# Patient Record
Sex: Female | Born: 1955
Health system: Southern US, Community
[De-identification: ages and names within clinical notes are randomized; demographics above are authoritative.]

## PROBLEM LIST (undated history)

## (undated) DIAGNOSIS — J329 Chronic sinusitis, unspecified: Secondary | ICD-10-CM

## (undated) DIAGNOSIS — J302 Other seasonal allergic rhinitis: Secondary | ICD-10-CM

## (undated) DIAGNOSIS — Z860100 Personal history of colon polyps, unspecified: Secondary | ICD-10-CM

## (undated) DIAGNOSIS — I10 Essential (primary) hypertension: Secondary | ICD-10-CM

## (undated) DIAGNOSIS — T7840XA Allergy, unspecified, initial encounter: Secondary | ICD-10-CM

## (undated) HISTORY — PX: TUBAL LIGATION: SHX77

## (undated) HISTORY — DX: Allergy, unspecified, initial encounter: T78.40XA

## (undated) HISTORY — DX: Personal history of colonic polyps: Z86.010

## (undated) HISTORY — DX: Chronic sinusitis, unspecified: J32.9

## (undated) HISTORY — PX: ESOPHAGOGASTRODUODENOSCOPY: SHX1529

## (undated) HISTORY — DX: Personal history of colon polyps, unspecified: Z86.0100

## (undated) HISTORY — PX: SMALL INTESTINE SURGERY: SHX150

---

## 2004-06-24 HISTORY — PX: ABDOMINAL HYSTERECTOMY: SHX81

## 2004-06-24 HISTORY — PX: TOTAL ABDOMINAL HYSTERECTOMY: SHX209

## 2004-12-04 ENCOUNTER — Other Ambulatory Visit: Admission: RE | Admit: 2004-12-04 | Discharge: 2004-12-04 | Payer: Self-pay | Admitting: Obstetrics and Gynecology

## 2009-06-29 ENCOUNTER — Encounter: Payer: Self-pay | Admitting: Family Medicine

## 2009-07-04 LAB — CONVERTED CEMR LAB: LDL Cholesterol: 127 mg/dL

## 2009-07-05 ENCOUNTER — Ambulatory Visit: Payer: Self-pay | Admitting: Family Medicine

## 2009-07-05 DIAGNOSIS — H6690 Otitis media, unspecified, unspecified ear: Secondary | ICD-10-CM | POA: Insufficient documentation

## 2009-07-05 DIAGNOSIS — I10 Essential (primary) hypertension: Secondary | ICD-10-CM | POA: Insufficient documentation

## 2009-07-06 ENCOUNTER — Encounter (INDEPENDENT_AMBULATORY_CARE_PROVIDER_SITE_OTHER): Payer: Self-pay | Admitting: *Deleted

## 2009-07-07 ENCOUNTER — Telehealth (INDEPENDENT_AMBULATORY_CARE_PROVIDER_SITE_OTHER): Payer: Self-pay | Admitting: *Deleted

## 2009-08-16 ENCOUNTER — Ambulatory Visit: Payer: Self-pay | Admitting: Family Medicine

## 2009-08-16 DIAGNOSIS — G43009 Migraine without aura, not intractable, without status migrainosus: Secondary | ICD-10-CM

## 2009-08-16 DIAGNOSIS — J309 Allergic rhinitis, unspecified: Secondary | ICD-10-CM | POA: Insufficient documentation

## 2009-08-17 LAB — CONVERTED CEMR LAB
BUN: 17 mg/dL (ref 6–23)
CO2: 30 meq/L (ref 19–32)
Chloride: 105 meq/L (ref 96–112)
Glucose, Bld: 90 mg/dL (ref 70–99)
Potassium: 3.7 meq/L (ref 3.5–5.3)

## 2010-02-15 ENCOUNTER — Ambulatory Visit: Payer: Self-pay | Admitting: Family Medicine

## 2010-02-15 DIAGNOSIS — H00019 Hordeolum externum unspecified eye, unspecified eyelid: Secondary | ICD-10-CM | POA: Insufficient documentation

## 2010-06-21 ENCOUNTER — Ambulatory Visit: Payer: Self-pay | Admitting: Family Medicine

## 2010-06-21 DIAGNOSIS — M545 Low back pain: Secondary | ICD-10-CM

## 2010-07-24 NOTE — Assessment & Plan Note (Signed)
Summary: HTN, allergies   Vital Signs:  Patient profile:   55 year old female Height:      63 inches Weight:      138 pounds Pulse rate:   71 / minute BP sitting:   99 / 66  (left arm) Cuff size:   regular  Vitals Entered By: Kathlene November (August 16, 2009 10:31 AM) CC: followup BP, Hypertension Management   Primary Care Provider:  Nani Gasser, MD  CC:  followup BP and Hypertension Management.  History of Present Illness: Havin alot of sinus and drainage. Having alot of sinus pressure. Says did see ENT and they think she has migraines.  Nasal itching and some sneezing.  No prior hx of allegies.Not taking anything for her current sxs.  no worsening or alleviating sxs.  Was given Nabumetone and told to f/u in 6 weeks.   Hypertension History:      She complains of headache, but denies chest pain, palpitations, dyspnea with exertion, orthopnea, PND, peripheral edema, visual symptoms, neurologic problems, syncope, and side effects from treatment.  She notes the following problems with antihypertensive medication side effects: Feels a little fatigue and weak. Marland Kitchen        Positive major cardiovascular risk factors include hypertension and current tobacco user.  Negative major cardiovascular risk factors include female age less than 55 years old.     Current Medications (verified): 1)  Vivelle-Dot 0.0375 Mg/24hr Pttw (Estradiol) .... Change Patch Every 7 Days 2)  Lisinopril-Hydrochlorothiazide 10-12.5 Mg Tabs (Lisinopril-Hydrochlorothiazide) .... Take 1 Tablet By Mouth Once A Day  Allergies (verified): 1)  Darvocet  Comments:  Nurse/Medical Assistant: The patient's medications and allergies were reviewed with the patient and were updated in the Medication and Allergy Lists. Kathlene November (August 16, 2009 10:32 AM)  Physical Exam  General:  Well-developed,well-nourished,in no acute distress; alert,appropriate and cooperative throughout examination Head:  Normocephalic and  atraumatic without obvious abnormalities. No apparent alopecia or balding. Eyes:  No corneal or conjunctival inflammation noted. EOMI. Perrla. Ears:  External ear exam shows no significant lesions or deformities.  Otoscopic examination reveals clear canals, tympanic membranes are intact bilaterally without bulging, retraction, inflammation or discharge. Hearing is grossly normal bilaterally. Nose:  External nasal examination shows no deformity or inflammation. Turbinates are swollen.  Mouth:  Oral mucosa and oropharynx without lesions or exudates.  Teeth in good repair. Neck:  No deformities, masses, or tenderness noted. Lungs:  Normal respiratory effort, chest expands symmetrically. Lungs are clear to auscultation, no crackles or wheezes. Heart:  Normal rate and regular rhythm. S1 and S2 normal without gallop, murmur, click, rub or other extra sounds. Skin:  no rashes.   Cervical Nodes:  No lymphadenopathy noted Psych:  Cognition and judgment appear intact. Alert and cooperative with normal attention span and concentration. No apparent delusions, illusions, hallucinations   Impression & Recommendations:  Problem # 1:  HYPERTENSION, BENIGN (ICD-401.1) Her BP is really too low today and she if feeling fatigued.  Will drop the diuretic and use only the lisinopril. F/U in 6 weeks to recheck BP.  If normal at that point will f/u in 6 months. Due to recheck potassium and Cr on the ACEi.   Will fax new rx to [facility]  Her updated medication list for this problem includes:    Lisinopril 10 Mg Tabs (Lisinopril) .Marland Kitchen... Take 1 tablet by mouth once a day  Orders: T-Basic Metabolic Panel [phone])  Problem # 2:  ALLERGIC RHINITIS (ICD-477.9) Assessment: New Trial of  zyrtec  for sxs relief. Call if not better. Will f/u in 6 weeks. This may also be aggrevating her migraines.   Complete Medication List: 1)  Vivelle-dot 0.0375 Mg/24hr Pttw (Estradiol) .... Change patch every 7 days 2)   Lisinopril 10 Mg Tabs (Lisinopril) .... Take 1 tablet by mouth once a day  Hypertension Assessment/Plan:      The patient's hypertensive risk group is category B: At least one risk factor (excluding diabetes) with no target organ damage.  Her calculated 10 year risk of coronary heart disease is 4 %.  Today's blood pressure is 99/66.      Patient Instructions: 1)  Try Zyrtec once a day at bedtime.  2)  Follow up in 6 weeks for repeat Blood pressure check.  Prescriptions: LISINOPRIL 10 MG TABS (LISINOPRIL) Take 1 tablet by mouth once a day  #30 x 2   Entered and Authorized by:   Nani Gasser MD   Signed by:   Nani Gasser MD on 08/16/2009   Method used:   Printed then faxed to ...         RxID:   [id number]

## 2010-07-24 NOTE — Progress Notes (Signed)
----   Converted from flag ---- ---- 07/06/2009 11:51 AM, Nani Gasser MD wrote: Call pt: did get a copy of her labs from Dr. Neva Seat.  Cholesterol is high so lets work on diet and exercise for 3 months and then recheck. Low fat diet. ------------------------------  Pt notified of MD instructions. KJ LPN

## 2010-07-24 NOTE — Assessment & Plan Note (Signed)
Summary: Lesion on the eye   Vital Signs:  Patient profile:   55 year old female Height:      63 inches Weight:      125 pounds Pulse rate:   69 / minute BP sitting:   153 / 86  (left arm) Cuff size:   regular  Vitals Entered By: Avon Gully CMA, Duncan Dull) (February 15, 2010 9:21 AM) CC: lesion on the eyelid rim x several days was large and resembled a stye but has since burst and now is smaller   Primary Care Provider:  Nani Gasser, MD  CC:  lesion on the eyelid rim x several days was large and resembled a stye but has since burst and now is smaller.  History of Present Illness: lesion on the eyelid rim x several days was large and resembled a stye but has since burst and now is smaller. Still alittle tender. No vision change or eye pain.   Had dry ithcy rash on her right elbow. Some on her left elbow as well. Just using lotion on it.    Current Medications (verified): 1)  Vivelle-Dot 0.0375 Mg/24hr Pttw (Estradiol) .... Change Patch Every 7 Days 2)  Lisinopril 10 Mg Tabs (Lisinopril) .... Take 1 Tablet By Mouth Once A Day  Allergies (verified): 1)  Darvocet  Comments:  Nurse/Medical Assistant: The patient's medications and allergies were reviewed with the patient and were updated in the Medication and Allergy Lists. Avon Gully CMA, Duncan Dull) (February 15, 2010 9:22 AM)  Physical Exam  General:  Well-developed,well-nourished,in no acute distress; alert,appropriate and cooperative throughout examination Eyes:  Left upper eye lid with  a little edema at the center along the lid edge. No papule or vesciel pesent. No active drainage.  Skin:  no rashes.   Cervical Nodes:  No lymphadenopathy noted Psych:  Cognition and judgment appear intact. Alert and cooperative with normal attention span and concentration. No apparent delusions, illusions, hallucinations   Impression & Recommendations:  Problem # 1:  HORDEOLUM (ICD-373.11) Assessment New Dsicussed warm  compressed and will tx with ABX ointmen. If not complelely resolving in 10 days then will refer to ophthalmology for further tx.   Complete Medication List: 1)  Vivelle-dot 0.0375 Mg/24hr Pttw (Estradiol) .... Change patch every 7 days 2)  Lisinopril 10 Mg Tabs (Lisinopril) .... Take 1 tablet by mouth once a day 3)  Triamcinolone Acetonide 0.5 % Crea (Triamcinolone acetonide) .... Apply two times a day to affected area. 4)  Erythromycin 5 Mg/gm Oint (Erythromycin) .... Applhy 1/2 inch ribbon to lower lid 4 x a day for one week.  Patient Instructions: 1)  If eyelid is not better in 10 days then call the office.  Prescriptions: ERYTHROMYCIN 5 MG/GM OINT (ERYTHROMYCIN) Applhy 1/2 inch ribbon to lower lid 4 x a day for one week.  #1 tube x 0   Entered and Authorized by:   Nani Gasser MD   Signed by:   Nani Gasser MD on 02/15/2010   Method used:   Print then Give to Patient   RxID:   [id number] TRIAMCINOLONE ACETONIDE 0.5 % CREA (TRIAMCINOLONE ACETONIDE) Apply two times a day to affected area.  #20 grams x 1   Entered and Authorized by:   Nani Gasser MD   Signed by:   Nani Gasser MD on 02/15/2010   Method used:   Print then Give to Patient   RxID:   [id number]

## 2010-07-24 NOTE — Letter (Signed)
Summary: Primary Care Consult Scheduled Letter  [place] [facility]  [address]   [place], [place] [address]   Phone: [phone]  Fax: [phone]      07/06/2009 MRN: [id number]  [facility] [address] [place], [place]  [address]    Dear Ms. Staggs,      We have scheduled an appointment for you.  At the recommendation of Dr.Metheney, we have scheduled you a consult with [facility] , Dr Terrall Laity on January 31  at 10:15 .  Their address is [address] [place] . The office phone number is [phone].  If this appointment day and time is not convenient for you, please feel free to call the office of the doctor you are being referred to at the number listed above and reschedule the appointment.     It is important for you to keep your scheduled appointments. We are here to make sure you are given good patient care. If you have questions or you have made changes to your appointment, please notify us at  [phone], ask for [facility].    Thank you,  Patient Care Coordinator [place] at [facility]

## 2010-07-24 NOTE — Assessment & Plan Note (Signed)
Summary: NOV: HTN, recurrent OM   Vital Signs:  Patient profile:   55 year old female Height:      63 inches Weight:      131 pounds BMI:     23.29 Pulse rate:   72 / minute BP sitting:   150 / 86  (left arm) Cuff size:   regular  Vitals Entered By: Kathlene November (July 05, 2009 1:54 PM) CC: NP- BP elevated. Has recurrent ear infections- last one was 2-3 days ago Is Patient Diabetic? No   Primary Care Provider:  Nani Gasser, MD  CC:  NP- BP elevated. Has recurrent ear infections- last one was 2-3 days ago.  History of Present Illness: NP- BP elevated. Has recurrent ear infections- last one was 2-3 days ago.  Was on lisinoprol 10/12.5mg   but has been awhile. Did well on this medicine without side effects. Has been over a year. Occ notices some swelling her hands, none in her feet.  Having vision changes and aches. No hx of kidney disease.    Hx of recurrent ear infecgtions on her right side. Had 3 episodes this year.  Has been to [facility] for these and it does clear up. No hearing loss.  NO pain or symptoms right now.  Last infection was about 3 weeks ago. Occ will drain with infection.  Will start out as soreness. Hx of allergies but not actively taking any allergy meds.  Occ will notice some mild nasal congestion.   She is schedule for her colonoscopy and her mammogram.    Habits & Providers  Alcohol-Tobacco-Diet     Alcohol drinks/day: 0     Tobacco Status: current     Cigarette Packs/Day: 0.25     Year Started: 10 years  Exercise-Depression-Behavior     Does Patient Exercise: no     STD Risk: never     Drug Use: never     Seat Belt Use: always  Current Medications (verified): 1)  Vivelle-Dot 0.0375 Mg/24hr Pttw (Estradiol) .... Change Patch Every 7 Days  Allergies (verified): 1)  Darvocet  Comments:  Nurse/Medical Assistant: The patient's medications and allergies were reviewed with the patient and were updated in the Medication and Allergy Lists. Kathlene November  (July 05, 2009 1:57 PM)  Past History:  Past Surgical History: Hysterectomy and one ovary 2006 Tubal ligation  Family History: Sister with BrCa Father with MI Aunts with HTN.   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  3 y college.  Married sot [place] with 2 adult kids.  Smoking Status:  current Packs/Day:  0.25 Does Patient Exercise:  no STD Risk:  never Drug Use:  never Seat Belt Use:  always  Review of Systems       No fever/sweats/weakness, unexplained weight loss/gain.  No vison changes.  No difficulty hearing/ringing in ears, + hay fever/allergies.  No chest pain/discomfort, palpitations.  No Br lump/nipple discharge.  No cough/wheeze.  No blood in BM, nausea/vomiting/diarrhea.  No nighttime urination, leaking urine, unusual vaginal bleeding, discharge (penis or vagina).  No muscle/joint pain. No rash, change in mole.  + HA, no memory loss.  No anxiety, + sleep d/o, no depression.  No easy bruising/bleeding, unexplained lump   Physical Exam  General:  Well-developed,well-nourished,in no acute distress; alert,appropriate and cooperative throughout examination Head:  Normocephalic and atraumatic without obvious abnormalities. No apparent alopecia or balding. Eyes:  No corneal or conjunctival inflammation noted. EOMI. Perrla.  Ears:  External ear exam shows  no significant lesions or deformities.  Otoscopic examination reveals clear canals, tympanic membranes are intact bilaterally without bulging, retraction, inflammation or discharge. Hearing is grossly normal bilaterally. Mouth:  Oral mucosa and oropharynx without lesions or exudates.  Teeth in good repair. Neck:  No deformities, masses, or tenderness noted. NO TM.  Lungs:  Normal respiratory effort, chest expands symmetrically. Lungs are clear to auscultation, no crackles or wheezes. Heart:  Normal rate and regular rhythm. S1 and S2 normal without gallop, murmur, click, rub or other extra sounds. No  carotid or abdominal bruits.  Pulses:  Radial 2+.  Extremities:  No LE edema.  Neurologic:  alert & oriented X3 and gait normal.   Skin:  no rashes.   Cervical Nodes:  Right anterior cern node is mildly swollen and tender.  Psych:  Cognition and judgment appear intact. Alert and cooperative with normal attention span and concentration. No apparent delusions, illusions, hallucinations   Impression & Recommendations:  Problem # 1:  HYPERTENSION, BENIGN (ICD-401.1) Pt just had labs done at her OB office so will call and get these adn need to make sure she has had CMP, Lipids, and TSH. If not then I will callher and we will get these. Discussed since did so well on the lisinopril HCT will restart it. Call if SE. FU in 6 weeks to recheck and make sur BP well controlled.  Her updated medication list for this problem includes:    Lisinopril-hydrochlorothiazide 10-12.5 Mg Tabs (Lisinopril-hydrochlorothiazide) .Marland Kitchen... Take 1 tablet by mouth once a day  Problem # 2:  OTITIS MEDIA, RECURRENT (ICD-382.9) Right TM is clear today but she does have a swollen LN. Unsure if residual from last infection or not.  Asked her to call if gets larger or more tender or ear starts hurting.  Will refer to ENT.    Orders: ENT Referral (ENT)  Complete Medication List: 1)  Vivelle-dot 0.0375 Mg/24hr Pttw (Estradiol) .... Change patch every 7 days 2)  Lisinopril-hydrochlorothiazide 10-12.5 Mg Tabs (Lisinopril-hydrochlorothiazide) .... Take 1 tablet by mouth once a day  Other Orders: Tdap => 21yrs IM (04540) Admin 1st Vaccine (98119) :  Patient Instructions: 1)  Please schedule a follow-up appointment in 6 weeks to recheck Blood pressure.  2)  We will call you if we need extra labs.  Prescriptions: LISINOPRIL-HYDROCHLOROTHIAZIDE 10-12.5 MG TABS (LISINOPRIL-HYDROCHLOROTHIAZIDE) Take 1 tablet by mouth once a day  #30 x 1   Entered and Authorized by:   Nani Gasser MD   Signed by:   Nani Gasser MD on  07/05/2009   Method used:   Print then Give to Patient   RxID:   [id number]   Flu Vaccine Result Date:  04/24/2009 Flu Vaccine Result:  given Flu Vaccine Next Due:  1 yr   Immunizations Administered:  Tetanus Vaccine:    Vaccine Type: Tdap    Site: left deltoid    Mfr: GlaxoSmithKline    Dose: 0.5 ml    Route: IM    Given by: Kathlene November    Exp. Date: 08/19/2011    Lot #: [id number]    VIS given: 05/12/07 version given July 05, 2009.    Lipid Panel Test Date: 07/04/2009                        Value        Units        H/L   Reference  Cholesterol:  223          mg/dL              (045-409) LDL Cholesterol:      127          mg/dL              (81-191) HDL Cholesterol:      84           mg/dL         H    (47-82) Triglyceride:         58           mg/dL              (95-621)
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
## 2010-07-27 NOTE — Letter (Signed)
Summary: [facility]  [facility]   Imported By: Lanelle Bal 07/13/2009 11:38:06  _____________________________________________________________________  External Attachment:    Type:   Image     Comment:   External Document

## 2010-08-07 ENCOUNTER — Encounter: Payer: Self-pay | Admitting: Family Medicine

## 2010-08-21 NOTE — Medication Information (Signed)
Summary: Medication Nonadherence Advisory  Medication Nonadherence Advisory   Imported By: Maryln Gottron 08/13/2010 11:10:15  _____________________________________________________________________  External Attachment:    Type:   Image     Comment:   External Document

## 2010-10-18 ENCOUNTER — Encounter: Payer: Self-pay | Admitting: Family Medicine

## 2010-10-19 ENCOUNTER — Ambulatory Visit (INDEPENDENT_AMBULATORY_CARE_PROVIDER_SITE_OTHER): Payer: BC Managed Care – PPO | Admitting: Family Medicine

## 2010-10-19 ENCOUNTER — Encounter: Payer: Self-pay | Admitting: Family Medicine

## 2010-10-19 DIAGNOSIS — I10 Essential (primary) hypertension: Secondary | ICD-10-CM

## 2010-10-19 DIAGNOSIS — J329 Chronic sinusitis, unspecified: Secondary | ICD-10-CM

## 2010-10-19 MED ORDER — LISINOPRIL-HYDROCHLOROTHIAZIDE 20-25 MG PO TABS: 1.0000 | ORAL_TABLET | Freq: Every day | ORAL | Status: DC

## 2010-10-19 MED ORDER — FLUTICASONE PROPIONATE 50 MCG/ACT NA SUSP: 2.0000 | Freq: Every day | NASAL | Status: DC

## 2010-10-19 NOTE — Progress Notes (Signed)
  Subjective:    Patient ID: Gina Hopkins, female    DOB: [birth year], 55 y.o.   MRN: [id number]  HPI Nasal congestion and HA for a few ays. Taking zyrtec for allergies.  Lots of drainage.  Had a right ear ache 5 days ago. Feels stopped up today but not hurting. Feels like she has a fever today. Says her allergies have been severe thi syear. Took some nyquail, no decongestants.   Hypertension-no chest pain shortness of breath or dizziness. She is taking her medication regularly. She denies any side effects with the medication. As above she is to take some NyQuil. She denies taking any decongestants.   Review of Systems     Objective:   Physical Exam  Constitutional: She is oriented to person, place, and time. She appears well-developed and well-nourished.  HENT:  Head: Normocephalic and atraumatic.  Right Ear: External ear normal.  Left Ear: External ear normal.  Nose: Nose normal.  Mouth/Throat: Oropharynx is clear and moist.       Turbinates are very swollen.  Eyes: Conjunctivae are normal. Pupils are equal, round, and reactive to light.  Neck: Neck supple. No thyromegaly present.  Cardiovascular: Normal rate, regular rhythm and normal heart sounds.   Pulmonary/Chest: Effort normal and breath sounds normal.  Lymphadenopathy:    She has no cervical adenopathy.  Neurological: She is alert and oriented to person, place, and time.  Skin: Skin is warm and dry.  Psychiatric: She has a normal mood and affect.          Assessment & Plan:  Sinusitis-I will treat her with an antibiotic. She also has significant edema of the turbinates so I will add a nasal steroid to oral antihistamine. She can call if she's not better in 7-10 days. Followup for reexamination at that time she's not improved.

## 2010-10-19 NOTE — Assessment & Plan Note (Signed)
Poorly controlled. She did take; last night which certainly could be contributing to her heart pressure today. The last visit she ran in the 150s systolic. We will add a diuretic to her regimen and followup in one month.

## 2010-10-19 NOTE — Patient Instructions (Signed)
Call me if not feeling better by about Wednesday with your sinsuses.

## 2010-10-23 ENCOUNTER — Telehealth: Payer: Self-pay | Admitting: Family Medicine

## 2010-10-23 NOTE — Telephone Encounter (Signed)
Pt left messge.  Had office visit Friday.  Lisinopril 20/25 was suppose to be sent to [facility] and it is not there, therefore will send since according to system it was printed and not sent.  Called [facility] and called lisinopril 20/25 mg po #[id number].  Pt notified.  Had to [facility] for patient. Jarvis Newcomer, LPN [facility] Dimes

## 2011-06-07 ENCOUNTER — Encounter: Payer: Self-pay | Admitting: Family Medicine

## 2011-06-07 ENCOUNTER — Ambulatory Visit (INDEPENDENT_AMBULATORY_CARE_PROVIDER_SITE_OTHER): Payer: BC Managed Care – PPO | Admitting: Family Medicine

## 2011-06-07 VITALS — BP 137/80 | HR 71 | Temp 97.9°F | Wt 131.0 lb

## 2011-06-07 DIAGNOSIS — M79609 Pain in unspecified limb: Secondary | ICD-10-CM

## 2011-06-07 DIAGNOSIS — Z1211 Encounter for screening for malignant neoplasm of colon: Secondary | ICD-10-CM

## 2011-06-07 DIAGNOSIS — J329 Chronic sinusitis, unspecified: Secondary | ICD-10-CM

## 2011-06-07 DIAGNOSIS — N949 Unspecified condition associated with female genital organs and menstrual cycle: Secondary | ICD-10-CM

## 2011-06-07 DIAGNOSIS — R202 Paresthesia of skin: Secondary | ICD-10-CM

## 2011-06-07 DIAGNOSIS — R209 Unspecified disturbances of skin sensation: Secondary | ICD-10-CM

## 2011-06-07 DIAGNOSIS — R102 Pelvic and perineal pain: Secondary | ICD-10-CM

## 2011-06-07 DIAGNOSIS — R300 Dysuria: Secondary | ICD-10-CM

## 2011-06-07 DIAGNOSIS — M79606 Pain in leg, unspecified: Secondary | ICD-10-CM

## 2011-06-07 LAB — POCT URINALYSIS DIPSTICK
Blood, UA: NEGATIVE
Glucose, UA: NEGATIVE
Leukocytes, UA: NEGATIVE
Nitrite, UA: NEGATIVE
Spec Grav, UA: 1.025
Urobilinogen, UA: 1
pH, UA: 5.5

## 2011-06-07 MED ORDER — AMOXICILLIN-POT CLAVULANATE 875-125 MG PO TABS: 1.0000 | ORAL_TABLET | Freq: Two times a day (BID) | ORAL | Status: DC

## 2011-06-07 NOTE — Progress Notes (Signed)
  Subjective:    Patient ID: Gina Hopkins, female    DOB: [birth year], 55 y.o.   MRN: [id number]  Sinusitis This is a new problem. The current episode started 1 to 4 weeks ago (2 weeks). The problem is unchanged. Maximum temperature: subjective fever. The pain is mild. Associated symptoms include congestion, coughing, ear pain, headaches, a hoarse voice, shortness of breath, sinus pressure, sneezing and a sore throat. Past treatments include spray decongestants and oral decongestants (flonase). The treatment provided mild relief.   Will get stinging pain in her thighs, cna happen when walking.  Started maybe several months ago. No worsening or alleviating sxs. Describes a peircing sensation that lasts for 15-20 minutes and will feel sore. Not on a statin. No known injury. Occ low back ache but stands on concret floors all day. Her thigh muscles are tender.   Says will have to hold her abdomen when she sits up in bed int eh morning  No nausea.  Bowels are moving normall. No blood in the stool.  Hx of hysterectomy. Still has one ovary.  Urinary frequency.   Review of Systems  HENT: Positive for ear pain, congestion, sore throat, hoarse voice, sneezing and sinus pressure.   Respiratory: Positive for cough and shortness of breath.   Neurological: Positive for headaches.       Objective:   Physical Exam  Constitutional: She is oriented to person, place, and time. She appears well-developed and well-nourished.  HENT:  Head: Normocephalic and atraumatic.  Right Ear: External ear normal.  Left Ear: External ear normal.  Nose: Nose normal.  Mouth/Throat: Oropharynx is clear and moist.       TMs and canals are clear.   Eyes: Conjunctivae and EOM are normal. Pupils are equal, round, and reactive to light.  Neck: Neck supple. No thyromegaly present.  Cardiovascular: Normal rate, regular rhythm and normal heart sounds.   Pulmonary/Chest: Effort normal and breath sounds normal. She has no wheezes.   Abdominal: Soft. Bowel sounds are normal. She exhibits no distension and no mass. There is tenderness. There is no rebound and no guarding.       Suprapubic tenderness.   Lymphadenopathy:    She has no cervical adenopathy.  Neurological: She is alert and oriented to person, place, and time.  Skin: Skin is warm and dry.  Psychiatric: She has a normal mood and affect. Her behavior is normal.          Assessment & Plan:  Sinusitis x 2 weeks. Will tx with ABX. Call if not better in one week. Symptomatic care.  Leg pain /stingling. - Unclear etiology. Based on her description it is unclear what exactly this is. I would like to check a thyroid level and B12 and iron to rule out a neuropathy. There is symptoms are bilateral. Also consider this could be coming from her low back. She does have some occasional discomfort when she stands for prolonged periods but nothing significant. She has not any medications that should be causing her symptoms.  Urinary frequency - UA neg except for protein. Will send a culture.   Pelvic Pain - Has never had her screening colonoscopy. Will refer. Pt agreeable.  Will check Ca-125. UA is neg except for protein If labs are negative. If labs normal consider STD testing and pelvic US. Maybe related to pain in her thighs

## 2011-06-07 NOTE — Patient Instructions (Signed)
Call me if not better in one week.  

## 2011-06-09 LAB — URINE CULTURE

## 2011-06-10 ENCOUNTER — Other Ambulatory Visit: Payer: Self-pay | Admitting: Family Medicine

## 2011-06-10 DIAGNOSIS — M25559 Pain in unspecified hip: Secondary | ICD-10-CM

## 2011-06-11 LAB — COMPLETE METABOLIC PANEL WITH GFR
ALT: 10 U/L (ref 0–35)
AST: 16 U/L (ref 0–37)
Albumin: 4.4 g/dL (ref 3.5–5.2)
Alkaline Phosphatase: 74 U/L (ref 39–117)
BUN: 16 mg/dL (ref 6–23)
Calcium: 9.6 mg/dL (ref 8.4–10.5)
Chloride: 103 mEq/L (ref 96–112)
Potassium: 4.1 mEq/L (ref 3.5–5.3)
Sodium: 138 mEq/L (ref 135–145)
Total Protein: 7.1 g/dL (ref 6.0–8.3)

## 2011-06-11 LAB — CBC
HCT: 44.4 % (ref 36.0–46.0)
Hemoglobin: 14.2 g/dL (ref 12.0–15.0)
MCHC: 32 g/dL (ref 30.0–36.0)
RBC: 4.69 MIL/uL (ref 3.87–5.11)

## 2011-06-11 LAB — VITAMIN B12: Vitamin B-12: 352 pg/mL (ref 211–911)

## 2011-06-11 LAB — IRON: Iron: 125 ug/dL (ref 42–145)

## 2011-06-11 LAB — TSH: TSH: 2.666 u[IU]/mL (ref 0.350–4.500)

## 2011-06-13 ENCOUNTER — Telehealth: Payer: Self-pay | Admitting: *Deleted

## 2011-06-13 MED ORDER — SULFAMETHOXAZOLE-TRIMETHOPRIM 800-160 MG PO TABS: 1.0000 | ORAL_TABLET | Freq: Two times a day (BID) | ORAL | Status: AC

## 2011-06-13 NOTE — Telephone Encounter (Signed)
Pt on Augmentin for sinus infection and has 4 days left and it is making her really nauseated. Her sinus sx's are still present: H/A, cough, pressure. Please advise

## 2011-06-13 NOTE — Telephone Encounter (Signed)
Willl change ot bactrim. Make sure to take with food and water and try eating a yogurt cup daily or taking a probiotic.

## 2011-06-14 NOTE — Telephone Encounter (Signed)
Pt notified. KJ LPN 

## 2011-06-21 ENCOUNTER — Other Ambulatory Visit: Payer: Self-pay | Admitting: Family Medicine

## 2011-06-21 DIAGNOSIS — R102 Pelvic and perineal pain: Secondary | ICD-10-CM

## 2011-06-27 ENCOUNTER — Other Ambulatory Visit: Payer: Self-pay | Admitting: Family Medicine

## 2011-06-27 DIAGNOSIS — R102 Pelvic and perineal pain: Secondary | ICD-10-CM

## 2011-06-28 ENCOUNTER — Ambulatory Visit
Admission: RE | Admit: 2011-06-28 | Discharge: 2011-06-28 | Disposition: A | Discharge status: Home / Self Care | Payer: BC Managed Care – PPO | Source: Ambulatory Visit | Attending: Family Medicine | Admitting: Family Medicine

## 2011-06-28 ENCOUNTER — Other Ambulatory Visit: Payer: BC Managed Care – PPO

## 2011-06-28 DIAGNOSIS — R102 Pelvic and perineal pain: Secondary | ICD-10-CM

## 2011-07-11 ENCOUNTER — Encounter: Payer: Self-pay | Admitting: *Deleted

## 2011-07-11 ENCOUNTER — Emergency Department
Admission: EM | Admit: 2011-07-11 | Discharge: 2011-07-11 | Disposition: A | Discharge status: Home / Self Care | Payer: BC Managed Care – PPO | Source: Home / Self Care

## 2011-07-11 DIAGNOSIS — J069 Acute upper respiratory infection, unspecified: Secondary | ICD-10-CM

## 2011-07-11 DIAGNOSIS — J029 Acute pharyngitis, unspecified: Secondary | ICD-10-CM

## 2011-07-11 HISTORY — DX: Other seasonal allergic rhinitis: J30.2

## 2011-07-11 HISTORY — DX: Essential (primary) hypertension: I10

## 2011-07-11 LAB — POCT RAPID STREP A (OFFICE): Rapid Strep A Screen: NEGATIVE

## 2011-07-11 MED ORDER — AZITHROMYCIN 250 MG PO TABS: ORAL_TABLET | ORAL | Status: AC

## 2011-07-11 NOTE — ED Provider Notes (Signed)
History     CSN: [id number]  Arrival date & time 07/11/11  0956   None     Chief Complaint  Patient presents with  . Headache  . Sore Throat  . Nausea  . Otalgia    (Consider location/radiation/quality/duration/timing/severity/associated sxs/prior treatment) HPI Gina Hopkins is a 56 y.o. female who complains of onset of cold symptoms for 2-3 days. She already had the flu shot.  She is feeling a little better today compared to yesterday.  Taking Alkaseltzer and Tylenol. + sore throat No cough No pleuritic pain No wheezing + nasal congestion + post-nasal drainage No sinus pain/pressure No chest congestion No itchy/red eyes No earache No hemoptysis No SOB No chills/sweats + fever No nausea No vomiting No abdominal pain No diarrhea No skin rashes No fatigue No myalgias No headache    No past medical history on file.  Past Surgical History  Procedure Date  . Abdominal hysterectomy 2006    and one ovary  . Tubal ligation     Family History  Problem Relation Age of Onset  . Heart attack Father   . Cancer Sister     Br Ca  . Hypertension Other     History  Substance Use Topics  . Smoking status: Never Smoker   . Smokeless tobacco: Not on file  . Alcohol Use: Not on file    OB History    Grav Para Term Preterm Abortions TAB SAB Ect Mult Living                  Review of Systems  Allergies  Propoxyphene n-acetaminophen  Home Medications   Current Outpatient Rx  Name Route Sig Dispense Refill  . CYCLOBENZAPRINE HCL 10 MG PO TABS Oral Take 10 mg by mouth. 1/2 to 1 tab qhs for muscle spasm     . ESTRADIOL 0.0375 MG/24HR TD PTTW Transdermal Place 1 patch onto the skin every 7 (seven) days.      Marland Kitchen FLUTICASONE PROPIONATE 50 MCG/ACT NA SUSP Nasal 2 sprays by Nasal route daily. 16 g 2  . LISINOPRIL-HYDROCHLOROTHIAZIDE 20-25 MG PO TABS Oral Take 1 tablet by mouth daily. 30 tablet 2    There were no vitals taken for this visit.  Physical Exam  Nursing  note and vitals reviewed. Constitutional: She is oriented to person, place, and time. She appears well-developed and well-nourished.  HENT:  Head: Normocephalic and atraumatic.  Right Ear: Tympanic membrane, external ear and ear canal normal.  Left Ear: Tympanic membrane, external ear and ear canal normal.  Nose: Mucosal edema and rhinorrhea present.  Mouth/Throat: Posterior oropharyngeal erythema present. No oropharyngeal exudate or posterior oropharyngeal edema.  Eyes: No scleral icterus.  Neck: Neck supple.  Cardiovascular: Regular rhythm and normal heart sounds.   Pulmonary/Chest: Effort normal and breath sounds normal. No respiratory distress.  Neurological: She is alert and oriented to person, place, and time.  Skin: Skin is warm and dry.  Psychiatric: She has a normal mood and affect. Her speech is normal.    ED Course  Procedures (including critical care time)  Labs Reviewed - No data to display No results found.   No diagnosis found.    MDM  1)  Take the prescribed antibiotic as instructed.  Hold for a few days especially since getting better.  Rapid strep test negative. 2)  Use nasal saline solution (over the counter) at least 3 times a day. 3)  Use over the counter decongestants like Zyrtec-D every 12 hours as  needed to help with congestion.  If you have hypertension, do not take medicines with sudafed.  4)  Can take tylenol every 6 hours or motrin every 8 hours for pain or fever. 5)  Follow up with your primary doctor if no improvement in 5-7 days, sooner if increasing pain, fever, or new symptoms.     Lily Kocher, MD 07/11/11 [phone]

## 2011-07-11 NOTE — ED Notes (Signed)
Pt c/o sore throat, HA, dizziness, nausea, HA and RT ear ache x 2 days. She has taken tylenol and alka seltzer.

## 2011-07-30 ENCOUNTER — Encounter: Payer: Self-pay | Admitting: *Deleted

## 2012-01-30 ENCOUNTER — Encounter: Payer: Self-pay | Admitting: Family Medicine

## 2012-01-30 ENCOUNTER — Ambulatory Visit (INDEPENDENT_AMBULATORY_CARE_PROVIDER_SITE_OTHER): Payer: BC Managed Care – PPO | Admitting: Family Medicine

## 2012-01-30 VITALS — BP 129/82 | HR 69 | Ht 63.0 in | Wt 132.0 lb

## 2012-01-30 DIAGNOSIS — M7712 Lateral epicondylitis, left elbow: Secondary | ICD-10-CM

## 2012-01-30 DIAGNOSIS — I10 Essential (primary) hypertension: Secondary | ICD-10-CM

## 2012-01-30 DIAGNOSIS — M7710 Lateral epicondylitis, unspecified elbow: Secondary | ICD-10-CM

## 2012-01-30 MED ORDER — LISINOPRIL-HYDROCHLOROTHIAZIDE 20-25 MG PO TABS: 1.0000 | ORAL_TABLET | Freq: Every day | ORAL | Status: DC

## 2012-01-30 NOTE — Progress Notes (Signed)
CC: Gina Hopkins is a 56 y.o. female is here for Hypertension   Subjective: HPI: Patient presents due to concerns of elevated blood pressure work today. She is she became dizzy quite suddenly took her blood pressure at that time and tells me that was recorded at 188/87. At that time she denied any headaches, vision changes, motor or sensory disturbances, chest pain, palpitations, nor confusion. She states that she has never experienced any peripheral edema orthopnea nor shortness of breath. She admits to having trouble meeting 100% compliance with taking her lisinopril hydrochlorothiazide medicine every morning and admits to skipping medication today. She took her medication at the time the above blood pressure was recorded and within minutes felt better.  She also complains of some left arm pain that has been presents for matter of months. She describes it as an achy sensation that often radiates down the lateral dorsal part of her arm. 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 No interventions as of yet. She denies any motor weakness or any sensory disturbances in the arm. There's been no recent trauma. She denies any swelling, pallor, no coolness to the affected arm.   Review Of Systems Outlined In HPI  Past Medical History  Diagnosis Date  . Hypertension   . Seasonal allergies      Family History  Problem Relation Age of Onset  . Heart attack Father   . Cancer Sister     Br Ca  . Hypertension Sister   . Hypertension Other   . Hypertension Mother      History  Substance Use Topics  . Smoking status: Current Everyday Smoker -- 0.2 packs/day  . Smokeless tobacco: Not on file  . Alcohol Use: No     Objective: Filed Vitals:   01/30/12 1322  BP: 129/82  Pulse:     General: Alert and Oriented, No Acute Distress HEENT: Pupils equal, round, reactive to light. Conjunctivae clear.   Moist mucous  membranes, pharynx without inflammation nor lesions.  Neck supple without palpable lymphadenopathy nor abnormal masses. Lungs: Clear to auscultation bilaterally, no wheezing/ronchi/rales.  Comfortable work of breathing. Good air movement. Cardiac: Regular rate and rhythm. Normal S1/S2.  No murmurs, rubs, nor gallops.  No carotid bruits Extremities: No peripheral edema.  Strong peripheral pulses.  Tender to palpation of lateral left epicondile, pain reproduced with resisted wrist extension and passive wrist flexion.  Full rom and strenght in LUE. Mental Status: No depression, anxiety, nor agitation. Skin: Warm and dry.  Assessment & Plan: Gina Hopkins was seen today for hypertension.  Diagnoses and associated orders for this visit:  Hypertension, benign - lisinopril-hydrochlorothiazide (PRINZIDE,ZESTORETIC) 20-25 MG per tablet; Take 1 tablet by mouth daily.  Left tennis elbow  Other Orders - Discontinue: lisinopril-hydrochlorothiazide (PRINZIDE,ZESTORETIC) 20-25 MG per tablet; Take 1 tablet by mouth daily. - fluticasone (FLONASE) 50 MCG/ACT nasal spray; Place 2 sprays into the nose daily.    Manual blood pressures taken by myself which was in the preoperative range. Patient and I made  a joint decision that it be best for her to focus on compliance of taking her medication on a daily basis rather than increasing her medication to get better control of her hypertensive issues. Refills was provided. Encouraged low-salt diet physical activity to help lower blood pressure. For lateral epicondylitis stretching and range of motion exercises were given to the patient and shown to her by myself. I've asked  her to do this daily for 4 weeks. She was additionally given a brace which was applied and fit myself, I asked her to wear this whenever she is up doing repetitive movements and not to use it at night. Ice as needed and nonsteroidal anti-inflammatories as needed. Return in 4 weeks  Return in about 1  month (around 03/01/2012).  Requested Prescriptions   Signed Prescriptions Disp Refills  . lisinopril-hydrochlorothiazide (PRINZIDE,ZESTORETIC) 20-25 MG per tablet 30 tablet 11    Sig: Take 1 tablet by mouth daily.

## 2012-01-30 NOTE — Patient Instructions (Signed)
Lateral Epicondylitis (Tennis Elbow) with Rehab Lateral epicondylitis involves inflammation and pain around the outer portion of the elbow. The pain is caused by inflammation of the tendons in the forearm that bring back (extend) the wrist. Lateral epicondylittis is also called tennis elbow, because it is very common in tennis players. However, it may occur in any individual who extends the wrist repetitively. If lateral epicondylitis is left untreated, it may become a chronic problem. SYMPTOMS   Pain, tenderness, and inflammation on the outer (lateral) side of the elbow.   Pain or weakness with gripping activities.   Pain that increases with wrist twisting motions (playing tennis, using a screwdriver, opening a door or a jar).   Pain with lifting objects, including a coffee cup.  CAUSES  Lateral epicondylitis is caused by inflammation of the tendons that extend the wrist. Causes of injury may include:  Repetitive stress and strain on the muscles and tendons that extend the wrist.   Sudden change in activity level or intensity.   Incorrect grip in racquet sports.   Incorrect grip size of racquet (often too large).   Incorrect hitting position or technique (usually backhand, leading with the elbow).   Using a racket that is too heavy.  RISK INCREASES WITH:  Sports or occupations that require repetitive and/or strenuous forearm and wrist movements (tennis, squash, racquetball, carpentry).   Poor wrist and forearm strength and flexibility.   Failure to warm up properly before activity.   Resuming activity before healing, rehabilitation, and conditioning are complete.  PREVENTION   Warm up and stretch properly before activity.   Maintain physical fitness:   Strength, flexibility, and endurance.   Cardiovascular fitness.   Wear and use properly fitted equipment.   Learn and use proper technique and have a coach correct improper technique.   Wear a tennis elbow  (counterforce) brace.  PROGNOSIS  The course of this condition depends on the degree of the injury. If treated properly, acute cases (symptoms lasting less than 4 weeks) are often resolved in 2 to 6 weeks. Chronic (longer lasting cases) often resolve in 3 to 6 months, but may require physical therapy. RELATED COMPLICATIONS   Frequently recurring symptoms, resulting in a chronic problem. Properly treating the problem the first time decreases frequency of recurrence.   Chronic inflammation, scarring tendon degeneration, and partial tendon tear, requiring surgery.   Delayed healing or resolution of symptoms.  TREATMENT  Treatment first involves the use of ice and medicine, to reduce pain and inflammation. Strengthening and stretching exercises may help reduce discomfort, if performed regularly. These exercises may be performed at home, if the condition is an acute injury. Chronic cases may require a referral to a physical therapist for evaluation and treatment. Your caregiver may advise a corticosteroid injection, to help reduce inflammation. Rarely, surgery is needed. MEDICATION  If pain medicine is needed, nonsteroidal anti-inflammatory medicines (aspirin and ibuprofen), or other minor pain relievers (acetaminophen), are often advised.   Do not take pain medicine for 7 days before surgery.   Prescription pain relievers may be given, if your caregiver thinks they are needed. Use only as directed and only as much as you need.   Corticosteroid injections may be recommended. These injections should be reserved only for the most severe cases, because they can only be given a certain number of times.  HEAT AND COLD  Cold treatment (icing) should be applied for 10 to 15 minutes every 2 to 3 hours for inflammation and pain, and immediately   after activity that aggravates your symptoms. Use ice packs or an ice massage.   Heat treatment may be used before performing stretching and strengthening  activities prescribed by your caregiver, physical therapist, or athletic trainer. Use a heat pack or a warm water soak.  SEEK MEDICAL CARE IF: Symptoms get worse or do not improve in 2 weeks, despite treatment. EXERCISES  RANGE OF MOTION (ROM) AND STRETCHING EXERCISES - Epicondylitis, Lateral (Tennis Elbow) These exercises may help you when beginning to rehabilitate your injury. Your symptoms may go away with or without further involvement from your physician, physical therapist or athletic trainer. While completing these exercises, remember:   Restoring tissue flexibility helps normal motion to return to the joints. This allows healthier, less painful movement and activity.   An effective stretch should be held for at least 30 seconds.   A stretch should never be painful. You should only feel a gentle lengthening or release in the stretched tissue.  RANGE OF MOTION - Wrist Flexion, Active-Assisted  Extend your right / left elbow with your fingers pointing down.*   Gently pull the back of your hand towards you, until you feel a gentle stretch on the top of your forearm.   Hold this position for __________ seconds.  Repeat __________ times. Complete this exercise __________ times per day.  *If directed by your physician, physical therapist or athletic trainer, complete this stretch with your elbow bent, rather than extended. RANGE OF MOTION - Wrist Extension, Active-Assisted  Extend your right / left elbow and turn your palm upwards.*   Gently pull your palm and fingertips back, so your wrist extends and your fingers point more toward the ground.   You should feel a gentle stretch on the inside of your forearm.   Hold this position for __________ seconds.  Repeat __________ times. Complete this exercise __________ times per day. *If directed by your physician, physical therapist or athletic trainer, complete this stretch with your elbow bent, rather than extended. STRETCH - Wrist  Flexion  Place the back of your right / left hand on a tabletop, leaving your elbow slightly bent. Your fingers should point away from your body.   Gently press the back of your hand down onto the table by straightening your elbow. You should feel a stretch on the top of your forearm.   Hold this position for __________ seconds.  Repeat __________ times. Complete this stretch __________ times per day.  STRETCH - Wrist Extension   Place your right / left fingertips on a tabletop, leaving your elbow slightly bent. Your fingers should point backwards.   Gently press your fingers and palm down onto the table by straightening your elbow. You should feel a stretch on the inside of your forearm.   Hold this position for __________ seconds.  Repeat __________ times. Complete this stretch __________ times per day.  STRENGTHENING EXERCISES - Epicondylitis, Lateral (Tennis Elbow) These exercises may help you when beginning to rehabilitate your injury. They may resolve your symptoms with or without further involvement from your physician, physical therapist or athletic trainer. While completing these exercises, remember:   Muscles can gain both the endurance and the strength needed for everyday activities through controlled exercises.   Complete these exercises as instructed by your physician, physical therapist or athletic trainer. Increase the resistance and repetitions only as guided.   You may experience muscle soreness or fatigue, but the pain or discomfort you are trying to eliminate should never worsen during these exercises. If   this pain does get worse, stop and make sure you are following the directions exactly. If the pain is still present after adjustments, discontinue the exercise until you can discuss the trouble with your caregiver.  STRENGTH - Wrist Flexors  Sit with your right / left forearm palm-up and fully supported on a table or countertop. Your elbow should be resting below the  height of your shoulder. Allow your wrist to extend over the edge of the surface.   Loosely holding a __________ weight, or a piece of rubber exercise band or tubing, slowly curl your hand up toward your forearm.   Hold this position for __________ seconds. Slowly lower the wrist back to the starting position in a controlled manner.  Repeat __________ times. Complete this exercise __________ times per day.  STRENGTH - Wrist Extensors  Sit with your right / left forearm palm-down and fully supported on a table or countertop. Your elbow should be resting below the height of your shoulder. Allow your wrist to extend over the edge of the surface.   Loosely holding a __________ weight, or a piece of rubber exercise band or tubing, slowly curl your hand up toward your forearm.   Hold this position for __________ seconds. Slowly lower the wrist back to the starting position in a controlled manner.  Repeat __________ times. Complete this exercise __________ times per day.  STRENGTH - Ulnar Deviators  Stand with a ____________________ weight in your right / left hand, or sit while holding a rubber exercise band or tubing, with your healthy arm supported on a table or countertop.   Move your wrist, so that your pinkie travels toward your forearm and your thumb moves away from your forearm.   Hold this position for __________ seconds and then slowly lower the wrist back to the starting position.  Repeat __________ times. Complete this exercise __________ times per day STRENGTH - Radial Deviators  Stand with a ____________________ weight in your right / left hand, or sit while holding a rubber exercise band or tubing, with your injured arm supported on a table or countertop.   Raise your hand upward in front of you or pull up on the rubber tubing.   Hold this position for __________ seconds and then slowly lower the wrist back to the starting position.  Repeat __________ times. Complete this  exercise __________ times per day. STRENGTH - Forearm Supinators   Sit with your right / left forearm supported on a table, keeping your elbow below shoulder height. Rest your hand over the edge, palm down.   Gently grip a hammer or a soup ladle.   Without moving your elbow, slowly turn your palm and hand upward to a "thumbs-up" position.   Hold this position for __________ seconds. Slowly return to the starting position.  Repeat __________ times. Complete this exercise __________ times per day.  STRENGTH - Forearm Pronators   Sit with your right / left forearm supported on a table, keeping your elbow below shoulder height. Rest your hand over the edge, palm up.   Gently grip a hammer or a soup ladle.   Without moving your elbow, slowly turn your palm and hand upward to a "thumbs-up" position.   Hold this position for __________ seconds. Slowly return to the starting position.  Repeat __________ times. Complete this exercise __________ times per day.  STRENGTH - Grip  Grasp a tennis ball, a dense sponge, or a large, rolled sock in your hand.   Squeeze as hard   as you can, without increasing any pain.   Hold this position for __________ seconds. Release your grip slowly.  Repeat __________ times. Complete this exercise __________ times per day.  STRENGTH - Elbow Extensors, Isometric  Stand or sit upright, on a firm surface. Place your right / left arm so that your palm faces your stomach, and it is at the height of your waist.   Place your opposite hand on the underside of your forearm. Gently push up as your right / left arm resists. Push as hard as you can with both arms, without causing any pain or movement at your right / left elbow. Hold this stationary position for __________ seconds.  Gradually release the tension in both arms. Allow your muscles to relax completely before repeating. Document Released: 06/10/2005 Document Revised: 05/30/2011 Document Reviewed:  09/22/2008 [employer] Patient Information 2012 [employer]. 

## 2012-02-27 ENCOUNTER — Encounter: Payer: Self-pay | Admitting: Family Medicine

## 2012-02-27 ENCOUNTER — Ambulatory Visit (INDEPENDENT_AMBULATORY_CARE_PROVIDER_SITE_OTHER): Payer: BC Managed Care – PPO | Admitting: Family Medicine

## 2012-02-27 VITALS — BP 110/73 | HR 73 | Wt 127.0 lb

## 2012-02-27 DIAGNOSIS — Z720 Tobacco use: Secondary | ICD-10-CM

## 2012-02-27 DIAGNOSIS — M7710 Lateral epicondylitis, unspecified elbow: Secondary | ICD-10-CM

## 2012-02-27 DIAGNOSIS — I10 Essential (primary) hypertension: Secondary | ICD-10-CM

## 2012-02-27 DIAGNOSIS — E785 Hyperlipidemia, unspecified: Secondary | ICD-10-CM

## 2012-02-27 DIAGNOSIS — F17200 Nicotine dependence, unspecified, uncomplicated: Secondary | ICD-10-CM

## 2012-02-27 NOTE — Progress Notes (Signed)
  Subjective:    Patient ID: Gina Hopkins, female    DOB: Jul 09, 1955, 56 y.o.   MRN: [id number]  HPI HTN - Taking meds consistantly. N CP or SOB.   Left tennnis elbow.  Says has been wearing her brace. Once days she didn't wear it she felt pain radiating from her lateral elbow to her fingers. Felt her 4th adn 5th fingers were locking up.  Taking Extra strength tylenol.  She has been doing the exercises. She is right handed.   Review of Systems     Objective:   Physical Exam  Constitutional: She is oriented to person, place, and time. She appears well-developed and well-nourished.  Musculoskeletal:       Left elbow with normal range of motion. She was wearing her strength when she came in today. She is tender over the left lateral epicondyles. She's tender a little bit down her arm. Normal range of motion strength of her hand and fingers. No swelling or erythema.  Neurological: She is alert and oriented to person, place, and time.  Skin: Skin is warm and dry.  Psychiatric: She has a normal mood and affect. Her behavior is normal.         Assessment & Plan:  HTN- Well controlled today.  No chagnes.  F/U in 6 months.   Left latearl epicondylitis- discussed options of possible injection versus physical therapy versus a trial of Voltaren gel. She opted to try the gel for at least 2-3 weeks. If she's not improving at that time she said she'll likely call and make an appointment for an injection with me.   Hyperlipidemia-due to repeat lipids and CMP. Lab slip given today.  Tobacco abuse-encourage cessation.

## 2012-02-27 NOTE — Patient Instructions (Signed)
Apply to outer elbow 4 times a day.

## 2013-02-09 ENCOUNTER — Ambulatory Visit (INDEPENDENT_AMBULATORY_CARE_PROVIDER_SITE_OTHER): Payer: BC Managed Care – PPO

## 2013-02-09 ENCOUNTER — Ambulatory Visit (INDEPENDENT_AMBULATORY_CARE_PROVIDER_SITE_OTHER): Payer: BC Managed Care – PPO | Admitting: Family Medicine

## 2013-02-09 ENCOUNTER — Encounter: Payer: Self-pay | Admitting: Family Medicine

## 2013-02-09 ENCOUNTER — Encounter: Payer: Self-pay | Admitting: *Deleted

## 2013-02-09 VITALS — BP 88/63 | HR 72 | Ht 62.0 in | Wt 121.0 lb

## 2013-02-09 DIAGNOSIS — F17200 Nicotine dependence, unspecified, uncomplicated: Secondary | ICD-10-CM

## 2013-02-09 DIAGNOSIS — R002 Palpitations: Secondary | ICD-10-CM

## 2013-02-09 DIAGNOSIS — R0789 Other chest pain: Secondary | ICD-10-CM

## 2013-02-09 DIAGNOSIS — Z87891 Personal history of nicotine dependence: Secondary | ICD-10-CM

## 2013-02-09 DIAGNOSIS — Z720 Tobacco use: Secondary | ICD-10-CM

## 2013-02-09 DIAGNOSIS — I959 Hypotension, unspecified: Secondary | ICD-10-CM

## 2013-02-09 DIAGNOSIS — R05 Cough: Secondary | ICD-10-CM

## 2013-02-09 NOTE — Patient Instructions (Signed)
Cut your BP pill in half because your pressure is low today.

## 2013-02-09 NOTE — Progress Notes (Signed)
Subjective:    Patient ID: Gina Hopkins, female    DOB: 1956-05-01, 57 y.o.   MRN: [id number]  HPI  Palpitations - came on suddenly started yesterday, after ate lunch. Didn't feel stressed when it happened.  Had a soda for lunch but that is usual.  Lasted over an hour. Not sure how fast it was going.   has not had anymore episodes since. pt reports that she experienced a rapid heart beat x 1 hour. denies any SOB or CP.  Says afterwards lightheaded.  Felt a numbness and tingling in her left hand as well. That lasted the rest of the afternoon.  No diaphoresis. No hx of heart problems.  Had some palpitatins years ago.  ONce was told had a heart murmur.   Job is more stressful and has been working longer hours and often skips meals.   HTN-  Pt denies chest pain, SOB, dizziness, or swelling.  Taking meds as directed w/o problems.  Denies medication side effects.    Review of Systems  BP 88/63  Pulse 72  Ht 5\' 2"  (1.575 m)  Wt 121 lb (54.885 kg)  BMI 22.13 kg/m2    Allergies  Allergen Reactions  . Propoxyphene-Acetaminophen     REACTION: nausea    Past Medical History  Diagnosis Date  . Hypertension   . Seasonal allergies     Past Surgical History  Procedure Laterality Date  . Abdominal hysterectomy  2006    and one ovary  . Tubal ligation      History   Social History  . Marital Status: Married    Spouse Name: N/A    Number of Children: N/A  . Years of Education: N/A   Occupational History  . Not on file.   Social History Main Topics  . Smoking status: Current Every Day Smoker -- 1.50 packs/day  . Smokeless tobacco: Not on file  . Alcohol Use: No  . Drug Use: No  . Sexual Activity: Not on file   Other Topics Concern  . Not on file   Social History Narrative  . No narrative on file    Family History  Problem Relation Age of Onset  . Heart attack Father   . Cancer Sister     Br Ca  . Hypertension Sister   . Hypertension Other   . Hypertension  Mother     Outpatient Encounter Prescriptions as of 02/09/2013  Medication Sig Dispense Refill  . cetirizine (ZYRTEC) 10 MG tablet Take 10 mg by mouth daily.      . cyclobenzaprine (FLEXERIL) 10 MG tablet Take 10 mg by mouth. 1/2 to 1 tab qhs for muscle spasm       . estradiol (VIVELLE-DOT) 0.0375 MG/24HR Place 1 patch onto the skin every 7 (seven) days.        . fluticasone (FLONASE) 50 MCG/ACT nasal spray Place 2 sprays into the nose daily.      Marland Kitchen lisinopril-hydrochlorothiazide (PRINZIDE,ZESTORETIC) 20-25 MG per tablet Take 1 tablet by mouth daily.  30 tablet  11   No facility-administered encounter medications on file as of 02/09/2013.          Objective:   Physical Exam  Constitutional: She is oriented to person, place, and time. She appears well-developed and well-nourished.  HENT:  Head: Normocephalic and atraumatic.  Neck: Neck supple. No thyromegaly present.  Cardiovascular: Normal rate, regular rhythm and normal heart sounds.   No carotid bruit  Pulmonary/Chest: Effort normal and breath sounds  normal.  Musculoskeletal: She exhibits no edema.  Lymphadenopathy:    She has no cervical adenopathy.  Neurological: She is alert and oriented to person, place, and time.  Skin: Skin is warm and dry.  Psychiatric: She has a normal mood and affect. Her behavior is normal.          Assessment & Plan:  Palpitations - new dx. Could have been secondary to hypotensive event as her BP is low today and she has lost 10 lbs. So her need for BP medication may have dropped.  When I look at her weight she has lost weight unintentionally but says she has been exercising.  Says her appetite isdown overall.  Will get CXR, CBC, CMP, TSH. EKG shows rate of 65 beats per minute, normal sinus rhythm with normal axis. No acute ST-T wave changes. Will get CXR as well.   Avoid caffeine for now.  Hypotension - Cut pill in-half and recheck in 2 weeks.  Drink plenty of fluids today and make sure staying  well hydrated. Likely low secondary to weight loss.    HTN- see note above on hypotension.  Tobacco abuse-encourage cessation. Reminded her of the risk of heart attack and stroke

## 2013-02-10 LAB — COMPLETE METABOLIC PANEL WITH GFR
Alkaline Phosphatase: 55 U/L (ref 39–117)
BUN: 19 mg/dL (ref 6–23)
CO2: 31 mEq/L (ref 19–32)
Creat: 0.94 mg/dL (ref 0.50–1.10)
GFR, Est African American: 78 mL/min
GFR, Est Non African American: 68 mL/min
Glucose, Bld: 77 mg/dL (ref 70–99)
Sodium: 138 mEq/L (ref 135–145)
Total Bilirubin: 0.9 mg/dL (ref 0.3–1.2)
Total Protein: 7 g/dL (ref 6.0–8.3)

## 2013-02-10 LAB — CBC WITH DIFFERENTIAL/PLATELET
Basophils Relative: 0 % (ref 0–1)
HCT: 40.9 % (ref 36.0–46.0)
Hemoglobin: 13.3 g/dL (ref 12.0–15.0)
Lymphocytes Relative: 51 % — ABNORMAL HIGH (ref 12–46)
Lymphs Abs: 3 10*3/uL (ref 0.7–4.0)
MCHC: 32.5 g/dL (ref 30.0–36.0)
Monocytes Absolute: 0.5 10*3/uL (ref 0.1–1.0)
Monocytes Relative: 8 % (ref 3–12)
Neutro Abs: 2.2 10*3/uL (ref 1.7–7.7)
Neutrophils Relative %: 36 % — ABNORMAL LOW (ref 43–77)
RBC: 4.37 MIL/uL (ref 3.87–5.11)
WBC: 6 10*3/uL (ref 4.0–10.5)

## 2013-02-10 LAB — CK TOTAL AND CKMB (NOT AT ARMC)
CK, MB: 2.5 ng/mL (ref 0.3–4.0)
Relative Index: 1.9 (ref 0.0–2.5)
Total CK: 131 U/L (ref 7–177)

## 2013-02-10 LAB — VITAMIN B12: Vitamin B-12: 310 pg/mL (ref 211–911)

## 2013-02-10 LAB — TROPONIN I: Troponin I: 0.03 ng/mL (ref <0.06)

## 2013-02-10 LAB — FOLATE: Folate: 11.2 ng/mL

## 2013-02-10 LAB — FERRITIN: Ferritin: 105 ng/mL (ref 10–291)

## 2013-02-23 ENCOUNTER — Encounter: Payer: Self-pay | Admitting: Family Medicine

## 2013-02-23 ENCOUNTER — Ambulatory Visit (INDEPENDENT_AMBULATORY_CARE_PROVIDER_SITE_OTHER): Payer: BC Managed Care – PPO | Admitting: Family Medicine

## 2013-02-23 VITALS — BP 117/76 | HR 69 | Wt 121.0 lb

## 2013-02-23 DIAGNOSIS — I10 Essential (primary) hypertension: Secondary | ICD-10-CM

## 2013-02-23 MED ORDER — HYDROCHLOROTHIAZIDE 12.5 MG PO CAPS: 12.5000 mg | ORAL_CAPSULE | Freq: Every day | ORAL | Status: DC

## 2013-02-23 NOTE — Progress Notes (Signed)
  Subjective:    Patient ID: Gina Hopkins, female    DOB: 20-Aug-1955, 57 y.o.   MRN: [id number]  HPI 2 week followup for low blood pressure. She's been really stressed out at work, skipping meals, and has lost 10 pounds. We did some extra blood work that was normal. We cut her blood pressure pill in half. She says she still feels tired even on half of a tab.Says she would like to keep her diuretic bc it helps control the occ swelling in her hands. .  No CP or SOB.    Review of Systems     Objective:   Physical Exam  Constitutional: She is oriented to person, place, and time. She appears well-developed and well-nourished.  HENT:  Head: Normocephalic and atraumatic.  Cardiovascular: Normal rate, regular rhythm and normal heart sounds.   Pulmonary/Chest: Effort normal and breath sounds normal.  Neurological: She is alert and oriented to person, place, and time.  Skin: Skin is warm and dry.  Psychiatric: She has a normal mood and affect. Her behavior is normal.          Assessment & Plan:  HTN - will stop the lisinorpil and keep the hctz per her preference. Still encourage her not to skip meals.  Recommend smoking cessation. F/U in 3-4 months to make sure BP staying well controlled. shw ill mnitor it as well.

## 2013-11-30 ENCOUNTER — Ambulatory Visit (INDEPENDENT_AMBULATORY_CARE_PROVIDER_SITE_OTHER): Payer: BC Managed Care – PPO | Admitting: Family Medicine

## 2013-11-30 ENCOUNTER — Encounter: Payer: Self-pay | Admitting: Family Medicine

## 2013-11-30 VITALS — BP 144/84 | HR 65 | Wt 125.0 lb

## 2013-11-30 DIAGNOSIS — B9689 Other specified bacterial agents as the cause of diseases classified elsewhere: Secondary | ICD-10-CM

## 2013-11-30 DIAGNOSIS — A499 Bacterial infection, unspecified: Secondary | ICD-10-CM

## 2013-11-30 DIAGNOSIS — M7710 Lateral epicondylitis, unspecified elbow: Secondary | ICD-10-CM

## 2013-11-30 DIAGNOSIS — M7711 Lateral epicondylitis, right elbow: Secondary | ICD-10-CM

## 2013-11-30 DIAGNOSIS — J329 Chronic sinusitis, unspecified: Secondary | ICD-10-CM

## 2013-11-30 MED ORDER — DOXYCYCLINE HYCLATE 100 MG PO TABS: ORAL_TABLET | ORAL | Status: AC

## 2013-11-30 MED ORDER — FLUTICASONE PROPIONATE 50 MCG/ACT NA SUSP: 2.0000 | Freq: Every day | NASAL | Status: DC

## 2013-11-30 NOTE — Progress Notes (Signed)
CC: Gina Hopkins is a 58 y.o. female is here for blacked out this am   Subjective: HPI:  Reports lightheadedness and dizziness that occurred earlier today at work described as mild/moderate in severity lasting about 1 minute. Came on gradually and resolved gradually without any intervention. She reports she had been standing for a matter of minutes when symptoms came on.  She's never had this before. Review of symptoms is positive for facial pressure beneath the right eye, nasal congestion, subjective postnasal drip, and sore throat that has been present for the past 3-4 days and worsening on a daily basis. She denies any other motor or sensory disturbances recently or remotely other than that described above  She also reports that this morning she awoke with right elbow pain is localized in the lateral aspect and is slightly radiating towards the hand on the dorsal aspect of the arm.  Symptoms are worse with extension of the wrist or elbow. She denies swelling redness nor warmth overlying the site of discomfort and denies any interventions as of yet  Review Of Systems Outlined In HPI  Past Medical History  Diagnosis Date  . Hypertension   . Seasonal allergies     Past Surgical History  Procedure Laterality Date  . Abdominal hysterectomy  2006    and one ovary  . Tubal ligation     Family History  Problem Relation Age of Onset  . Heart attack Father   . Cancer Sister     Br Ca  . Hypertension Sister   . Hypertension Other   . Hypertension Mother     History   Social History  . Marital Status: Married    Spouse Name: N/A    Number of Children: N/A  . Years of Education: N/A   Occupational History  . Not on file.   Social History Main Topics  . Smoking status: Current Every Day Smoker -- 1.50 packs/day  . Smokeless tobacco: Not on file  . Alcohol Use: No  . Drug Use: No  . Sexual Activity: Not on file   Other Topics Concern  . Not on file   Social History  Narrative  . No narrative on file     Objective: BP 144/84  Pulse 65  Wt 125 lb (56.7 kg)  General: Alert and Oriented, No Acute Distress HEENT: Pupils equal, round, reactive to light. Conjunctivae clear.  External ears unremarkable, canals clear with intact TMs with appropriate landmarks.  Middle ear appears open without effusion. Pink inferior turbinates.  Moist mucous membranes, pharynx without inflammation nor lesions however moderate cobblestoning.  Neck supple without palpable lymphadenopathy nor abnormal masses. Right maxillary sinus discomfort to percussion Lungs: Clear to auscultation bilaterally, no wheezing/ronchi/rales.  Comfortable work of breathing. Good air movement. Cardiac: Regular rate and rhythm. Normal S1/S2.  No murmurs, rubs, nor gallops.   Neuro: CN II-XII grossly intact, full strength/rom of all four extremities, C5/L4/S1 DTRs 2/4 bilaterally, gait normal, rapid alternating movements normal, heel-shin test normal, Rhomberg normal. Extremities: Right elbow pain is reproduced with palpation of lateral epicondyles or resisted wrist extension with no overlying swelling nor redness or warmth Mental Status: No depression, anxiety, nor agitation. Skin: Warm and dry.  Assessment & Plan: Lynee was seen today for blacked out this am.  Diagnoses and associated orders for this visit:  Bacterial sinusitis - doxycycline (VIBRA-TABS) 100 MG tablet; One by mouth twice a day for ten days. - fluticasone (FLONASE) 50 MCG/ACT nasal spray; Place 2 sprays  into both nostrils daily.  Right tennis elbow    Bacterial sinusitis: Suspect this is what caused her dizziness episode today, she confirms multiple times that she did not lose consciousness.  Start doxycycline and fluticasone, avoid decongestants. Right tennis elbow: Begin home exercise/rehabilitation on a daily basis, she was given a handout demonstrating these interventions. Return if no benefit after 2-3 weeks   Return if  symptoms worsen or fail to improve.

## 2014-02-10 IMAGING — CR DG CHEST 2V
2 series · 2 of 2 positions shown · non-contrast
Comparison: None.

CLINICAL DATA: Palpitations, cough, smoking history

CHEST - 2 VIEW

[view not recorded (1 of 2)]
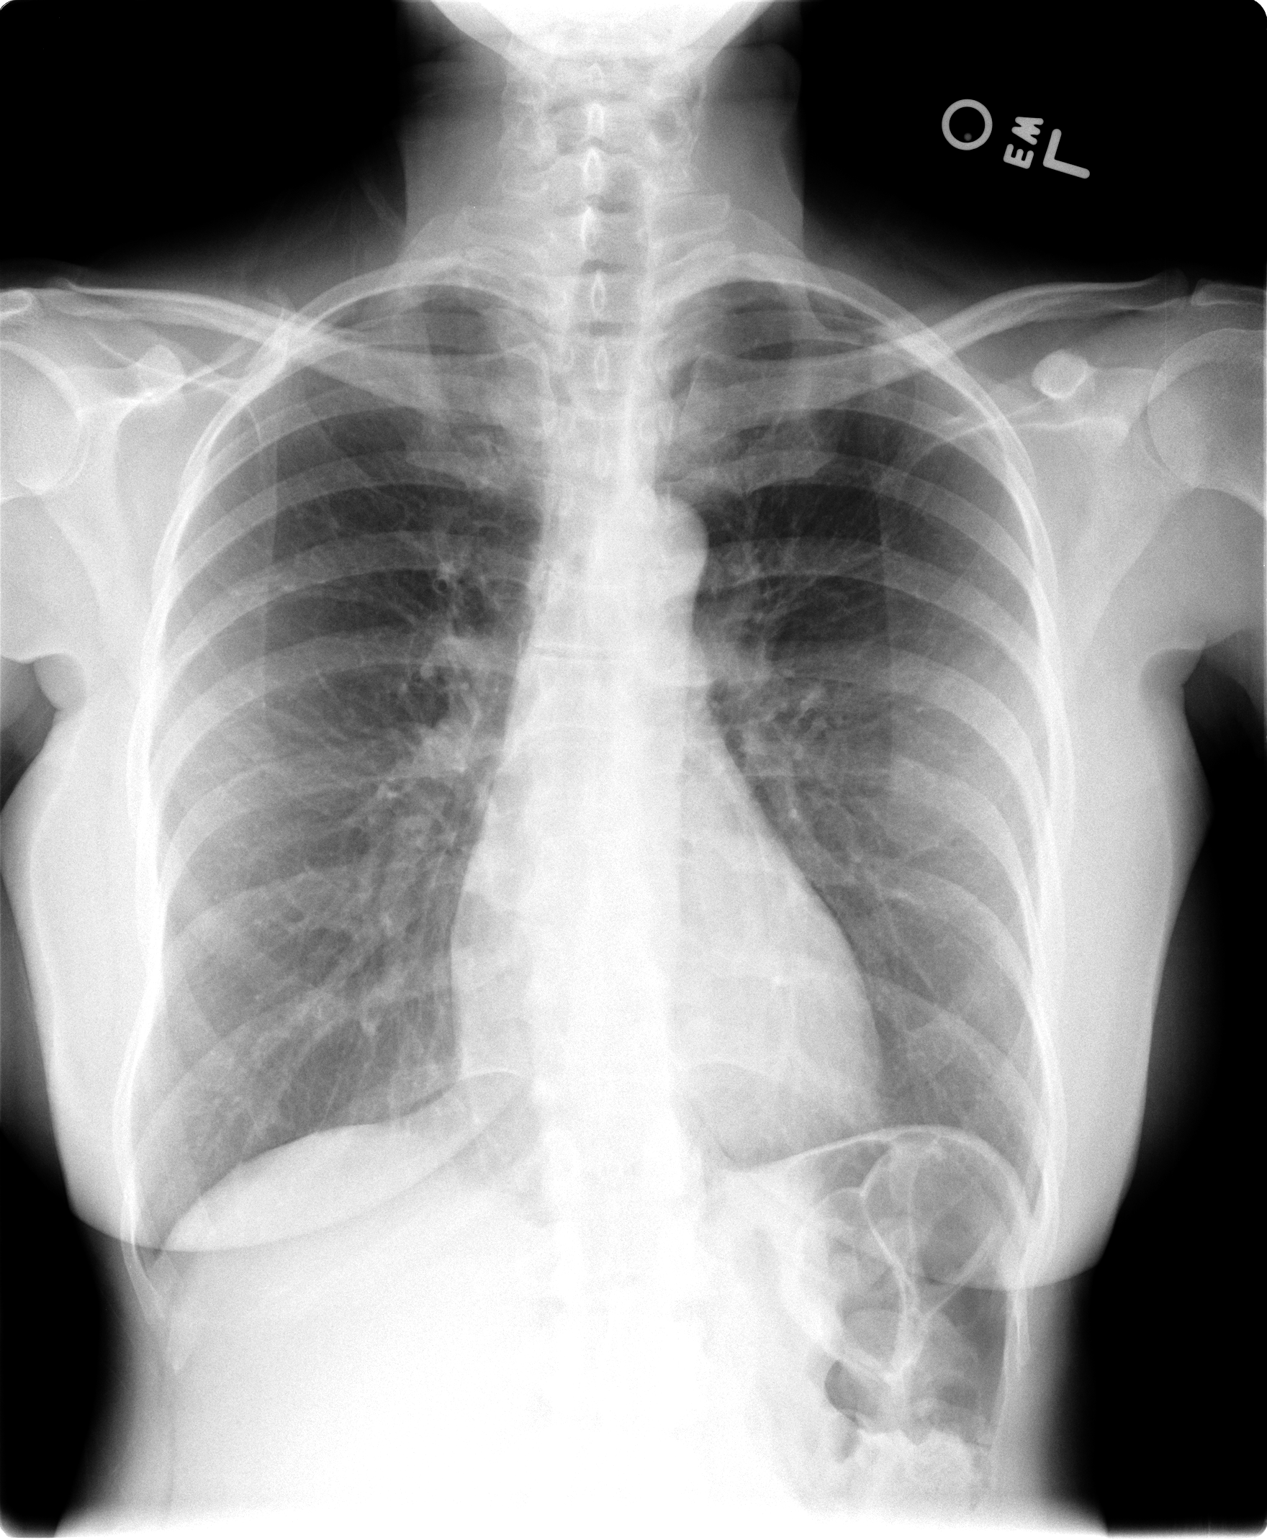

[view not recorded (2 of 2)]
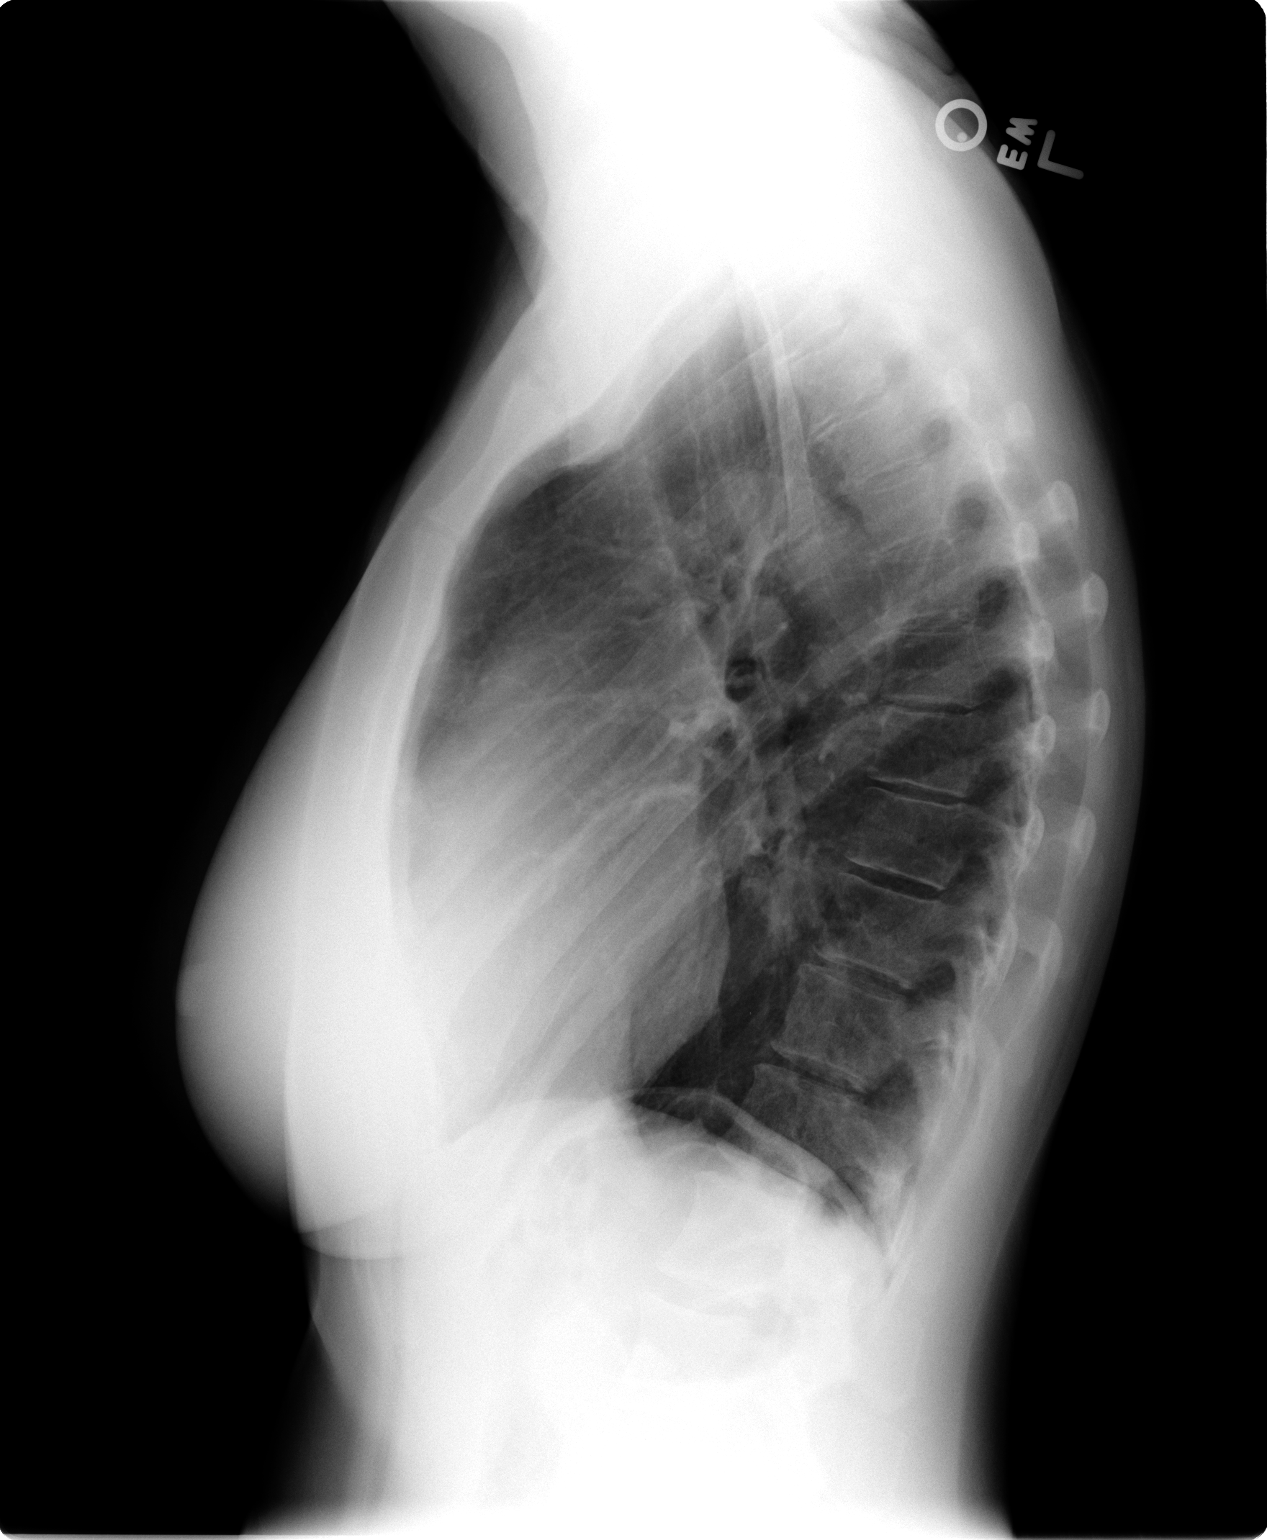

[2 of 2 positions shown; findings below may reference images not displayed]

FINDINGS: No active infiltrate or effusion is seen.  The lungs are
slightly hyperaerated.  Mediastinal contours appear normal.  No
bony abnormality is seen.
IMPRESSION: No active lung disease.

## 2014-03-21 ENCOUNTER — Telehealth: Payer: Self-pay | Admitting: *Deleted

## 2014-03-21 NOTE — Telephone Encounter (Signed)
Gina Hopkins calls this morning c/o rash like symptoms that she has has for a few days. She said that it is raised and slightly painful but mostly "tingly" feeling. She said that they appear to be fluid filled and are getting worse instead of better. She said that the rash is on her right breast area and on her right neck/arm region. She denies fever or SOB and has not changed or added meds, had animals in the house or changed body care routines. Please advise. Margette Fast, CMA

## 2014-03-21 NOTE — Telephone Encounter (Signed)
Gina Hopkins is scheduled to be seen tomorrow. Margette Fast, CMA

## 2014-03-21 NOTE — Telephone Encounter (Signed)
Needs appt.  See if can get in tomorrow. Can double book if needed.

## 2014-03-22 ENCOUNTER — Encounter: Payer: Self-pay | Admitting: Family Medicine

## 2014-03-22 ENCOUNTER — Ambulatory Visit (INDEPENDENT_AMBULATORY_CARE_PROVIDER_SITE_OTHER): Payer: BC Managed Care – PPO | Admitting: Family Medicine

## 2014-03-22 VITALS — BP 124/81 | HR 75 | Ht 62.0 in | Wt 125.0 lb

## 2014-03-22 DIAGNOSIS — R21 Rash and other nonspecific skin eruption: Secondary | ICD-10-CM

## 2014-03-22 MED ORDER — TRIAMCINOLONE ACETONIDE 0.5 % EX OINT: 1.0000 "application " | TOPICAL_OINTMENT | Freq: Every day | CUTANEOUS | Status: DC

## 2014-03-22 NOTE — Progress Notes (Signed)
   Subjective:    Patient ID: Gina Hopkins, female    DOB: Oct 02, 1955, 58 y.o.   MRN: [id number]  Rash   Rash started 4 days ago on her upper  Chest and noticed 2 lesion on her right wrist.  Says it is itchey and says it was more fine initially.  Using hydrocortisone and triple antibiotic ointment.  Did use a new lotion in the last week. NO changes iwht detergent and soap.  No yardwork.     Review of Systems  Skin: Positive for rash.       Objective:   Physical Exam  Constitutional: She appears well-developed and well-nourished.  HENT:  Head: Normocephalic and atraumatic.  Neurological: She is alert.  Skin: Skin is warm and dry. Rash noted.  Tired erythematous papules on the upper chest. She has 2 smaller erythematous papules on the left wrist. Is not located on the abdomen back or anywhere else on the skin. There no vesicles or pustules present. There are some excoriations.  Psychiatric: She has a normal mood and affect. Her behavior is normal.          Assessment & Plan:  Rash - unclear etiology but she feels the Effexor in getting a little bit better in the last 24 hours. Will give her a little bit stronger topical steroid cream. Discontinued use of the new lotion and look for anything else that may have triggered the rash. Anchors her to call back in a week if it's not clearing up with topical steroid cream.  Plans to get flu shot through work.

## 2014-04-01 ENCOUNTER — Telehealth: Payer: Self-pay

## 2014-04-01 NOTE — Telephone Encounter (Signed)
Gina Hopkins reports fast heart rate and shortness of breath one day last week and again now. Denies sweats, blurred vision, nausea or vomiting. She has some chest discomfort. Due to her family history, father had heart attack at age 58 and brother had onset of heart disease at age 68, she was advised to go to the ED ASAP and to have someone drive her.

## 2015-10-12 ENCOUNTER — Encounter: Payer: Self-pay | Admitting: Family Medicine

## 2015-10-12 ENCOUNTER — Ambulatory Visit (INDEPENDENT_AMBULATORY_CARE_PROVIDER_SITE_OTHER): Payer: BLUE CROSS/BLUE SHIELD | Admitting: Family Medicine

## 2015-10-12 ENCOUNTER — Ambulatory Visit (INDEPENDENT_AMBULATORY_CARE_PROVIDER_SITE_OTHER): Payer: BLUE CROSS/BLUE SHIELD

## 2015-10-12 VITALS — BP 122/79 | HR 74 | Wt 126.0 lb

## 2015-10-12 DIAGNOSIS — R0781 Pleurodynia: Secondary | ICD-10-CM

## 2015-10-12 DIAGNOSIS — R109 Unspecified abdominal pain: Secondary | ICD-10-CM

## 2015-10-12 MED ORDER — NAPROXEN 500 MG PO TABS: 500.0000 mg | ORAL_TABLET | Freq: Two times a day (BID) | ORAL | Status: DC

## 2015-10-12 MED ORDER — CYCLOBENZAPRINE HCL 10 MG PO TABS: 10.0000 mg | ORAL_TABLET | Freq: Every day | ORAL | Status: DC

## 2015-10-12 NOTE — Progress Notes (Signed)
   Subjective:    I'm seeing this patient as a consultation for:  Dr. Madilyn Fireman  CC: left rib and low back pain  HPI: patient has a 2 day history of left-sided rib and low back pain. Patient denies any injury. Pain is worse with activity. She denies any trouble breathing cough congestion or shortness of breath. She denies any leg pain or recent periods of immobility. She's tried some over-the-counter medicines which have not been very effective. No radiating pain weakness or numbness.  Past medical history, Surgical history, Family history not pertinant except as noted below, Social history, Allergies, and medications have been entered into the medical record, reviewed, and no changes needed.   Review of Systems: No headache, visual changes, nausea, vomiting, diarrhea, constipation, dizziness, abdominal pain, skin rash, fevers, chills, night sweats, weight loss, swollen lymph nodes, , joint swelling, muscle aches, chest pain, shortness of breath, mood changes, visual or auditory hallucinations.   Objective:    Filed Vitals:   10/12/15 1338  BP: 122/79  Pulse: 74   General: Well Developed, well nourished, and in no acute distress.  Neuro/Psych: Alert and oriented x3, extra-ocular muscles intact, able to move all 4 extremities, sensation grossly intact. Skin: Warm and dry, no rashes noted.  Respiratory: Not using accessory muscles, speaking in full sentences, trachea midline. Lungs are clear to auscultation bilaterally with normal symmetrical rib expansion bilaterally. No increased work of breathing Cardiovascular: Pulses palpable, no extremity edema. Abdomen: Does not appear distended. MSK: back is nontender to spinal midline. Tender palpation left sided thoracic and lumbar paraspinal muscles. Also tender palpation in the left lateral ribs with no crepitations palpated. Low back motion is intact but pain with flexion and rotation. Lower extremity strength is equal and normal throughout as  is sensation. Normal gait.  No results found for this or any previous visit (from the past 24 hour(s)). No results found.  Impression and Recommendations:   60 year old woman with left-sided rib and flank pain. Likely myofascial muscles spasm and strain. However there may be an occult rib fracture. Plan to obtain x-ray of the left ribs. Additionally we will refer to physical therapy. Return in 2-4 weeks if not better. Treat empirically with naproxen and Flexeril.  This case required medical decision making of moderate complexity.

## 2015-10-12 NOTE — Patient Instructions (Addendum)
Thank you for coming in today. Get the xray .  Attend PT.  Take naproxen and flexeril.  Return in 2-4 weeks as needed.   Lumbosacral Strain Lumbosacral strain is a strain of any of the parts that make up your lumbosacral vertebrae. Your lumbosacral vertebrae are the bones that make up the lower third of your backbone. Your lumbosacral vertebrae are held together by muscles and tough, fibrous tissue (ligaments).  CAUSES  A sudden blow to your back can cause lumbosacral strain. Also, anything that causes an excessive stretch of the muscles in the low back can cause this strain. This is typically seen when people exert themselves strenuously, fall, lift heavy objects, bend, or crouch repeatedly. RISK FACTORS  Physically demanding work.  Participation in pushing or pulling sports or sports that require a sudden twist of the back (tennis, golf, baseball).  Weight lifting.  Excessive lower back curvature.  Forward-tilted pelvis.  Weak back or abdominal muscles or both.  Tight hamstrings. SIGNS AND SYMPTOMS  Lumbosacral strain may cause pain in the area of your injury or pain that moves (radiates) down your leg.  DIAGNOSIS Your health care provider can often diagnose lumbosacral strain through a physical exam. In some cases, you may need tests such as X-ray exams.  TREATMENT  Treatment for your lower back injury depends on many factors that your clinician will have to evaluate. However, most treatment will include the use of anti-inflammatory medicines. HOME CARE INSTRUCTIONS   Avoid hard physical activities (tennis, racquetball, waterskiing) if you are not in proper physical condition for it. This may aggravate or create problems.  If you have a back problem, avoid sports requiring sudden body movements. Swimming and walking are generally safer activities.  Maintain good posture.  Maintain a healthy weight.  For acute conditions, you may put ice on the injured area.  Put ice in a  plastic bag.  Place a towel between your skin and the bag.  Leave the ice on for 20 minutes, 2-3 times a day.  When the low back starts healing, stretching and strengthening exercises may be recommended. SEEK MEDICAL CARE IF:  Your back pain is getting worse.  You experience severe back pain not relieved with medicines. SEEK IMMEDIATE MEDICAL CARE IF:   You have numbness, tingling, weakness, or problems with the use of your arms or legs.  There is a change in bowel or bladder control.  You have increasing pain in any area of the body, including your belly (abdomen).  You notice shortness of breath, dizziness, or feel faint.  You feel sick to your stomach (nauseous), are throwing up (vomiting), or become sweaty.  You notice discoloration of your toes or legs, or your feet get very cold. MAKE SURE YOU:   Understand these instructions.  Will watch your condition.  Will get help right away if you are not doing well or get worse.   This information is not intended to replace advice given to you by your health care provider. Make sure you discuss any questions you have with your health care provider.   Document Released: 03/20/2005 Document Revised: 07/01/2014 Document Reviewed: 01/27/2013 Elsevier Interactive Patient Education [employer].

## 2015-10-13 NOTE — Progress Notes (Signed)
Quick Note:  No rib fractures ______

## 2016-02-13 ENCOUNTER — Encounter: Payer: Self-pay | Admitting: Family Medicine

## 2016-02-13 ENCOUNTER — Ambulatory Visit (INDEPENDENT_AMBULATORY_CARE_PROVIDER_SITE_OTHER): Payer: BLUE CROSS/BLUE SHIELD | Admitting: Family Medicine

## 2016-02-13 VITALS — BP 116/65 | HR 67 | Resp 16 | Ht 62.0 in | Wt 126.0 lb

## 2016-02-13 DIAGNOSIS — Z114 Encounter for screening for human immunodeficiency virus [HIV]: Secondary | ICD-10-CM | POA: Diagnosis not present

## 2016-02-13 DIAGNOSIS — Z0000 Encounter for general adult medical examination without abnormal findings: Secondary | ICD-10-CM

## 2016-02-13 DIAGNOSIS — N951 Menopausal and female climacteric states: Secondary | ICD-10-CM

## 2016-02-13 DIAGNOSIS — Z0189 Encounter for other specified special examinations: Secondary | ICD-10-CM

## 2016-02-13 DIAGNOSIS — R232 Flushing: Secondary | ICD-10-CM

## 2016-02-13 DIAGNOSIS — Z1159 Encounter for screening for other viral diseases: Secondary | ICD-10-CM

## 2016-02-13 DIAGNOSIS — Z720 Tobacco use: Secondary | ICD-10-CM

## 2016-02-13 DIAGNOSIS — I10 Essential (primary) hypertension: Secondary | ICD-10-CM

## 2016-02-13 MED ORDER — LISINOPRIL-HYDROCHLOROTHIAZIDE 10-12.5 MG PO TABS: 1.0000 | ORAL_TABLET | Freq: Every day | ORAL | 1 refills | Status: DC

## 2016-02-13 NOTE — Progress Notes (Signed)
Subjective:     Gina Hopkins is a 60 y.o. female and is here for a comprehensive physical exam. The patient reports no problems.  Hypertension-she actually quit taking her chlorothiazide because she felt it was making her nauseated. She restarted her on lisinopril HCTZ but was actually splitting it and half. She tolerated that well without any side effects or problems. No chest pain, shortness of breath or palpitations. Swelling.  Social History   Social History  . Marital status: Married    Spouse name: N/A  . Number of children: N/A  . Years of education: N/A   Occupational History  . Pharmacy Tech     Social History Main Topics  . Smoking status: Current Every Day Smoker    Packs/day: 0.25  . Smokeless tobacco: Never Used  . Alcohol use No  . Drug use: No  . Sexual activity: Not on file   Other Topics Concern  . 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    Health Maintenance  Topic Date Due  . Hepatitis C Screening  01[birth year]02/[birth year]  . HIV Screening  04/16/1971  . MAMMOGRAM  07/25/2012  . INFLUENZA VACCINE  06/23/2016 (Originally 01/23/2016)  . COLONOSCOPY  07/29/2016  . TETANUS/TDAP  07/06/2019    The following portions of the patient's history were reviewed and updated as appropriate: allergies, current medications, past family history, past medical history, past social history, past surgical history and problem list.  Review of Systems A comprehensive review of systems was negative.   Objective:    BP 116/65 (BP Location: Left Arm, Patient Position: Sitting, Cuff Size: Normal)   Pulse 67   Resp 16   Ht 5\' 2"  (1.575 m)   Wt 126 lb (57.2 kg)   SpO2 100%   BMI 23.05 kg/m  General appearance: alert, cooperative and appears stated age Head: Normocephalic, without obvious abnormality, atraumatic Eyes: conj clear, EOMI, PEERLA Ears: normal TM's and external ear canals both ears Nose: Nares normal. Septum midline. Mucosa normal.  No drainage or sinus tenderness. Throat: lips, mucosa, and tongue normal; teeth and gums normal Neck: no adenopathy, no carotid bruit, no JVD, supple, symmetrical, trachea midline and thyroid not enlarged, symmetric, no tenderness/mass/nodules Back: symmetric, no curvature. ROM normal. No CVA tenderness. Lungs: clear to auscultation bilaterally Breasts: normal appearance, no masses or tenderness Heart: regular rate and rhythm, S1, S2 normal, no murmur, click, rub or gallop Abdomen: soft, non-tender; bowel sounds normal; no masses,  no organomegaly Extremities: extremities normal, atraumatic, no cyanosis or edema Pulses: 2+ and symmetric Skin: Skin color, texture, turgor normal. No rashes or lesions Lymph nodes: Cervical, supraclavicular, and axillary nodes normal. Neurologic: Alert and oriented X 3, normal strength and tone. Normal symmetric reflexes. Normal coordination and gait    Assessment:    Healthy female exam.      Plan:    CPE See After Visit Summary for Counseling Recommendations   Keep up a regular exercise program and make sure you are eating a healthy diet Try to eat 4 servings of dairy a day, or if you are lactose intolerant take a calcium with vitamin D daily.  Your vaccines are up to date.   Declined flu vaccine today as she says she will get it from work  Mammogram has been scheduled.  HTN - controlled. new rx sent so that she doesn't have to splint the lisinopril HCTZ. Will write for the 10/12.5 daily. Follow-up in  6 months.Erick Alley - Discussed options. Could consider trying one over-the-counter medication such as black cohosh or evening primrose oil or estroven.

## 2016-02-13 NOTE — Patient Instructions (Addendum)

## 2016-02-14 LAB — COMPLETE METABOLIC PANEL WITH GFR
ALBUMIN: 3.9 g/dL (ref 3.6–5.1)
ALK PHOS: 73 U/L (ref 33–130)
ALT: 8 U/L (ref 6–29)
AST: 13 U/L (ref 10–35)
BUN: 17 mg/dL (ref 7–25)
CALCIUM: 10 mg/dL (ref 8.6–10.4)
CO2: 28 mmol/L (ref 20–31)
CREATININE: 1.01 mg/dL (ref 0.50–1.05)
Chloride: 105 mmol/L (ref 98–110)
GFR, EST AFRICAN AMERICAN: 70 mL/min (ref >=60)
GFR, Est Non African American: 61 mL/min (ref >=60)
Glucose, Bld: 80 mg/dL (ref 65–99)
Potassium: 4.4 mmol/L (ref 3.5–5.3)
Sodium: 140 mmol/L (ref 135–146)
Total Bilirubin: 0.8 mg/dL (ref 0.2–1.2)
Total Protein: 6.4 g/dL (ref 6.1–8.1)

## 2016-02-14 LAB — LIPID PANEL
CHOLESTEROL: 217 mg/dL — AB (ref 125–200)
HDL: 92 mg/dL (ref >=46)
LDL Cholesterol: 113 mg/dL (ref <130)
TRIGLYCERIDES: 58 mg/dL (ref <150)
Total CHOL/HDL Ratio: 2.4 Ratio (ref <=5.0)
VLDL: 12 mg/dL (ref <30)

## 2016-02-14 LAB — HIV ANTIBODY (ROUTINE TESTING W REFLEX): HIV 1&2 Ab, 4th Generation: NONREACTIVE

## 2016-02-15 LAB — HEPATITIS C ANTIBODY: HCV AB: NEGATIVE

## 2016-03-20 ENCOUNTER — Encounter: Payer: Self-pay | Admitting: Osteopathic Medicine

## 2016-03-20 ENCOUNTER — Ambulatory Visit (INDEPENDENT_AMBULATORY_CARE_PROVIDER_SITE_OTHER): Payer: BLUE CROSS/BLUE SHIELD | Admitting: Osteopathic Medicine

## 2016-03-20 VITALS — BP 146/83 | HR 73 | Temp 98.7°F | Ht 62.0 in | Wt 127.0 lb

## 2016-03-20 DIAGNOSIS — B9789 Other viral agents as the cause of diseases classified elsewhere: Secondary | ICD-10-CM

## 2016-03-20 DIAGNOSIS — J069 Acute upper respiratory infection, unspecified: Secondary | ICD-10-CM | POA: Diagnosis not present

## 2016-03-20 DIAGNOSIS — J029 Acute pharyngitis, unspecified: Secondary | ICD-10-CM

## 2016-03-20 LAB — POCT RAPID STREP A (OFFICE): Rapid Strep A Screen: NEGATIVE

## 2016-03-20 MED ORDER — BENZONATATE 200 MG PO CAPS: 200.0000 mg | ORAL_CAPSULE | Freq: Three times a day (TID) | ORAL | 0 refills | Status: DC | PRN

## 2016-03-20 MED ORDER — IPRATROPIUM BROMIDE 0.03 % NA SOLN: 2.0000 | Freq: Three times a day (TID) | NASAL | 0 refills | Status: DC | PRN

## 2016-03-20 NOTE — Progress Notes (Signed)
HPI: Gina Hopkins is a 60 y.o. female who presents to [facility]  today for chief complaint of:  Chief Complaint  Patient presents with  . Sore Throat    Acute Illness: . Context:  Sore throat and coughing since yesterday . Location:  . Quality:  . Assoc signs/symptoms: see ROS . Duration: 1 days . Modifying factors: has tried the following OTC/Rx medications: Tylenol and Delsym   Past medical, social and family history reviewed. Current medications and allergies reviewed.     Review of Systems:  Constitutional: subjective fever/chills  HEENT: +headache, no vision change or hearing change, no sore throat  Cardiovascular: No chest pain, No pressure/palpitations  Respiratory: yes cough, no shortness of breath  Gastrointestinal: no nausea, no vomiting, no abdominal pain, no diarrhea  Musculoskeletal: no myalgia/arthralgia  Skin/Integument: no rash   Exam:  BP (!) 146/83   Pulse 73   Temp 98.7 F (37.1 C) (Oral)   Ht 5\' 2"  (1.575 m)   Wt 127 lb (57.6 kg)   BMI 23.23 kg/m   Constitutional: VSS, see above. General Appearance: alert, well-developed, well-nourished, NAD  Eyes: Normal lids and conjunctive, non-icteric sclera, PERRLA  Ears, Nose, Mouth, Throat: Normal external inspection ears/nares/mouth/lips/gums, normal TM, MMM; posterior pharynx with erythema, without exudate, nasal mucosa normal  Neck: No masses, trachea midline. normal lymph nodes size but mild tender on L  Respiratory: Normal respiratory effort. No  wheeze/rhonchi/rales  Cardiovascular: S1/S2 normal, no murmur/rub/gallop auscultated. RRR.  Results for orders placed or performed in visit on 03/20/16 (from the past 72 hour(s))  POCT rapid strep A     Status: None   Collection Time: 03/20/16 10:19 AM  Result Value Ref Range   Rapid Strep A Screen Negative Negative     ASSESSMENT/PLAN:  Sore throat - Plan: POCT rapid strep A  Viral URI with cough  - Plan: benzonatate (TESSALON) 200 MG capsule, ipratropium (ATROVENT) 0.03 % nasal spray   Patient Instructions  Most likely your symptoms are due to viral upper respiratory illness causing postnasal drip, sore throat, cough, and sinus congestion. The common cold or viral pharyngitis (sore throat) is not something that we can cure, but we can help control symptoms while your body fights the infection. You've been given prescription for nasal spray to help with sinus drainage, plus a prescription for cough medicine. Other over-the-counter remedies which are typically helpful include taking all of the following together: Ibuprofen, Tylenol, decongestants such as Sudafed, antihistamine such as Benadryl. People with blood pressure problems should be careful with Ibuprofen and Sudafed as these can raise blood pressure. Use caution, many generics are sold in cold/flu formulations as combination, always ask a pharmacist if you are concerned about any medication interactions or duplications. Look for lozenges which contain menthol plus benzocaine, these will help numb the throat and prevent cough. Be sure you're staying well hydrated with plenty of water or warm tea with honey.   Most people feel better from a viral upper respiratory infection in 7-10 days, though cough symptoms can linger for several weeks.   Please let us know if you're not getting better or if you get worse!      Visit summary was printed for the patient with medications and pertinent instructions for patient to review. ER/RTC precautions reviewed. All questions answered. Return if symptoms worsen or fail to improve.

## 2016-03-20 NOTE — Patient Instructions (Addendum)
Most likely your symptoms are due to viral upper respiratory illness causing postnasal drip, sore throat, cough, and sinus congestion. The common cold or viral pharyngitis (sore throat) is not something that we can cure, but we can help control symptoms while your body fights the infection.   You've been given prescription for nasal spray to help with sinus drainage, plus a prescription for cough medicine. Other over-the-counter remedies which are typically helpful include taking all of the following together: Ibuprofen, Tylenol, decongestants such as Sudafed, antihistamine such as Benadryl. People with blood pressure problems should be careful with Ibuprofen and Sudafed as these can raise blood pressure. Use caution, many generics are sold in cold/flu formulations as combination, always ask a pharmacist if you are concerned about any medication interactions or duplications. Look for lozenges which contain menthol plus benzocaine, these will help numb the throat and prevent cough. Be sure you're staying well hydrated with plenty of water or warm tea with honey.   Most people feel better from a viral upper respiratory infection in 7-10 days, though cough symptoms can linger for several weeks.   Please let us know if you're not getting better or if you get worse!

## 2016-04-09 ENCOUNTER — Ambulatory Visit (INDEPENDENT_AMBULATORY_CARE_PROVIDER_SITE_OTHER): Payer: BLUE CROSS/BLUE SHIELD | Admitting: Osteopathic Medicine

## 2016-04-09 VITALS — BP 123/76 | HR 67 | Wt 127.0 lb

## 2016-04-09 DIAGNOSIS — H6981 Other specified disorders of Eustachian tube, right ear: Secondary | ICD-10-CM

## 2016-04-09 DIAGNOSIS — J019 Acute sinusitis, unspecified: Secondary | ICD-10-CM | POA: Diagnosis not present

## 2016-04-09 MED ORDER — AMOXICILLIN-POT CLAVULANATE 875-125 MG PO TABS: 1.0000 | ORAL_TABLET | Freq: Two times a day (BID) | ORAL | 0 refills | Status: DC

## 2016-04-09 NOTE — Progress Notes (Signed)
HPI: Gina Hopkins is a 60 y.o. female  who presents to [facility] today, 04/09/16,  for chief complaint of:  Chief Complaint  Patient presents with  . Ear Pain    right     Recent viral upper respiratory illness, seems to have settled in right ear, right ear has been painful for about a week or so. Ipratropium nasal spray helped a lot with runny nose but overall congestion is about the same. Sinus pain/pressure. Over-the-counter guaifenesin has not helped.    Past medical, surgical, social and family history reviewed: Past Medical History:  Diagnosis Date  . Hypertension   . Seasonal allergies    Past Surgical History:  Procedure Laterality Date  . ABDOMINAL HYSTERECTOMY  2006   and one ovary  . TUBAL LIGATION     Social History  Substance Use Topics  . Smoking status: Current Every Day Smoker    Packs/day: 0.25  . Smokeless tobacco: Never Used  . Alcohol use No   Family History  Problem Relation Age of Onset  . Heart attack Father   . Cancer Sister     Br Ca  . Hypertension Sister   . Hypertension Other   . Hypertension Mother      Current medication list and allergy/intolerance information reviewed:   Current Outpatient Prescriptions  Medication Sig Dispense Refill  . cetirizine (ZYRTEC) 10 MG tablet Take 10 mg by mouth daily.    . cyclobenzaprine (FLEXERIL) 10 MG tablet Take 1 tablet (10 mg total) by mouth at bedtime. 1/2 to 1 tab qhs for muscle spasm 30 tablet 0  . fluticasone (FLONASE) 50 MCG/ACT nasal spray Place 2 sprays into both nostrils daily. 16 g 2  . ipratropium (ATROVENT) 0.03 % nasal spray Place 2 sprays into both nostrils 3 (three) times daily as needed for rhinitis. 30 mL 0  . lisinopril-hydrochlorothiazide (PRINZIDE,ZESTORETIC) 10-12.5 MG tablet Take 1 tablet by mouth daily. 90 tablet 1  . naproxen (NAPROSYN) 500 MG tablet Take 1 tablet (500 mg total) by mouth 2 (two) times daily with a meal. 30 tablet 0    No current facility-administered medications for this visit.    Allergies  Allergen Reactions  . Hydrochlorothiazide Nausea Only  . Propoxyphene N-Acetaminophen     REACTION: nausea      Review of Systems:  Constitutional:  No  fever, no chills, +recent illness,  HEENT: No  headache, no vision change, no hearing change, +sore throat, +sinus pressure, + ear discomfort as per history of present illness  Cardiac: No  chest pain, No  pressure,  Respiratory:  No  shortness of breath. +occasional dry Cough   Exam:  BP 123/76   Pulse 67   Wt 127 lb (57.6 kg)   BMI 23.23 kg/m   Constitutional: VS see above. General Appearance: alert, well-developed, well-nourished, NAD  Eyes: Normal lids and conjunctive, non-icteric sclera  Ears, Nose, Mouth, Throat: MMM, Normal external inspection ears/nares/mouth/lips/gums. TM normal bilaterally - scarred tympanic membrane on the left, right tympanic membrane has some clear effusion behind it visible, does not appear infected, light reflex intact and no bulging. Pharynx/tonsils no erythema, no exudate. Nasal mucosa normal.   Neck: No masses, trachea midline. No thyroid enlargement. No tenderness/mass appreciated. No lymphadenopathy  Respiratory: Normal respiratory effort. no wheeze, no rhonchi, no rales  Cardiovascular: S1/S2 normal, no murmur, no rub/gallop auscultated. RRR.   ASSESSMENT/PLAN:   Dysfunction of right eustachian tube  Acute non-recurrent sinusitis, unspecified location  Patient Instructions  Can try over-the-counter antihistamine such as Benadryl, Claritin, or similar generic. Can add Sudafed decongestant as well. If he still has some of the nasal spray left over, you can use this to hopefully help clear out the sinuses a little bit, or you can use over-the-counter saline nasal spray or Flonase. Antibiotics may hopefully help as well. If you are not doing any better after the antibiotics have completed, please let us know  and we may need to get imaging.     Visit summary with medication list and pertinent instructions was printed for patient to review. All questions at time of visit were answered - patient instructed to contact office with any additional concerns. ER/RTC precautions were reviewed with the patient. Follow-up plan: Return for Routine care as directed by PCP, sooner if symptoms worsen or fail to improve.

## 2016-04-09 NOTE — Patient Instructions (Signed)
Can try over-the-counter antihistamine such as Benadryl, Claritin, or similar generic. Can add Sudafed decongestant as well. If he still has some of the nasal spray left over, you can use this to hopefully help clear out the sinuses a little bit, or you can use over-the-counter saline nasal spray or Flonase. Antibiotics may hopefully help as well. If you are not doing any better after the antibiotics have completed, please let us know and we may need to get imaging.

## 2016-06-21 ENCOUNTER — Encounter: Payer: Self-pay | Admitting: Osteopathic Medicine

## 2016-06-25 LAB — HM MAMMOGRAPHY

## 2016-10-07 ENCOUNTER — Other Ambulatory Visit: Payer: Self-pay | Admitting: Family Medicine

## 2016-11-04 ENCOUNTER — Other Ambulatory Visit: Payer: Self-pay | Admitting: Family Medicine

## 2016-12-15 ENCOUNTER — Other Ambulatory Visit: Payer: Self-pay | Admitting: Family Medicine

## 2017-01-15 ENCOUNTER — Encounter: Payer: Self-pay | Admitting: Family Medicine

## 2017-01-15 ENCOUNTER — Ambulatory Visit (INDEPENDENT_AMBULATORY_CARE_PROVIDER_SITE_OTHER): Payer: BLUE CROSS/BLUE SHIELD | Admitting: Family Medicine

## 2017-01-15 ENCOUNTER — Telehealth: Payer: Self-pay | Admitting: Family Medicine

## 2017-01-15 VITALS — BP 96/61 | HR 69 | Ht 62.0 in | Wt 129.0 lb

## 2017-01-15 DIAGNOSIS — I10 Essential (primary) hypertension: Secondary | ICD-10-CM | POA: Diagnosis not present

## 2017-01-15 DIAGNOSIS — R21 Rash and other nonspecific skin eruption: Secondary | ICD-10-CM | POA: Diagnosis not present

## 2017-01-15 DIAGNOSIS — R232 Flushing: Secondary | ICD-10-CM

## 2017-01-15 DIAGNOSIS — G43009 Migraine without aura, not intractable, without status migrainosus: Secondary | ICD-10-CM | POA: Diagnosis not present

## 2017-01-15 MED ORDER — ESTRADIOL 0.0375 MG/24HR TD PTTW: 1.0000 | MEDICATED_PATCH | TRANSDERMAL | 3 refills | Status: DC

## 2017-01-15 MED ORDER — LISINOPRIL 10 MG PO TABS
10.0000 mg | ORAL_TABLET | Freq: Every day | ORAL | 3 refills | Status: DC
Start: 2017-01-15 — End: 2017-01-22

## 2017-01-15 MED ORDER — TRIAMCINOLONE ACETONIDE 0.5 % EX OINT: 1.0000 "application " | TOPICAL_OINTMENT | Freq: Every day | CUTANEOUS | 1 refills | Status: DC

## 2017-01-15 NOTE — Progress Notes (Signed)
Subjective:    CC:   HPI:  Hypertension- Pt denies chest pain, SOB, dizziness, or heart palpitations.  Taking meds as directed w/o problems.  Denies medication side effects.    She would like to discuss her night sweats. She was previously on the Vivelle-Dot hormone patch. She has had a hysterectomy and it was really helping with her sleep and night sweats and hot flashes. She has been off of it for quite some time now but really would like to go back on. She does get her yearly mammograms.  Migraine headaches-she says these are actually really well controlled. She has a headache very infrequently. She just mostly takes over-the-counter medication when she does get one.  Past medical history, Surgical history, Family history not pertinant except as noted below, Social history, Allergies, and medications have been entered into the medical record, reviewed, and corrections made.   Review of Systems: No fevers, chills, night sweats, weight loss, chest pain, or shortness of breath.   Objective:    General: Well Developed, well nourished, and in no acute distress.  Neuro: Alert and oriented x3, extra-ocular muscles intact, sensation grossly intact.  HEENT: Normocephalic, atraumatic  Skin: Warm and dry, no rashes. Cardiac: Regular rate and rhythm, no murmurs rubs or gallops, no lower extremity edema.  Respiratory: Clear to auscultation bilaterally. Not using accessory muscles, speaking in full sentences.   Impression and Recommendations:    HTN- Well-controlled. Will discontinue hydrochlorothiazide component of her blood pressure pill. I like to have her come back in a month just for nurse visit just to make sure that her blood pressure still is at goal on the reduced dose of medication.  Migraine headaches-well-controlled.  Hot flashes/night sweats-we'll restart hormone patch but did warn about the increased risks of being on hormone replacement therapy and explained that we don't  typically do this long-term.  Reminded her that she's due for her screening colonoscopy. She says she has received a letter in the call from her GI doctor but just has not scheduled it yet.  She also like a refill on her triamcinolone cream she gets a little dry eczematous rash particularly over her elbows mostly in the summer time and would like to have that refilled.

## 2017-01-15 NOTE — Telephone Encounter (Signed)
Pt called. Scripts written for today and in the future should go to [facility] in [place].  Thank you.

## 2017-01-22 ENCOUNTER — Other Ambulatory Visit: Payer: Self-pay | Admitting: *Deleted

## 2017-01-22 MED ORDER — LISINOPRIL 10 MG PO TABS: 10.0000 mg | ORAL_TABLET | Freq: Every day | ORAL | 3 refills | Status: DC

## 2017-01-22 MED ORDER — ESTRADIOL 0.0375 MG/24HR TD PTTW: 1.0000 | MEDICATED_PATCH | TRANSDERMAL | 3 refills | Status: DC

## 2017-01-22 MED ORDER — TRIAMCINOLONE ACETONIDE 0.5 % EX OINT: 1.0000 "application " | TOPICAL_OINTMENT | Freq: Every day | CUTANEOUS | 1 refills | Status: DC

## 2017-01-22 NOTE — Progress Notes (Unsigned)
[facility]. Patient no longer uses [facility]

## 2017-01-27 ENCOUNTER — Telehealth: Payer: Self-pay

## 2017-01-27 NOTE — Telephone Encounter (Signed)
Call pt: we can try the tablet that are inserted vaginally and then absorb through the tissue in that area.  They are likely much cheaper.

## 2017-01-27 NOTE — Telephone Encounter (Signed)
Patient called stated that Estridol patch is too high for her to get at this time. Patient wants to know if there is something else that you can recommend that would be affordable. Patient was advised to to call her insurance to find out if there is something cheaper that they would cover and to call us back and let us know. .please advise. Evyn Kooyman,CMA

## 2017-01-28 MED ORDER — ESTRADIOL 10 MCG VA TABS
10.0000 ug | ORAL_TABLET | VAGINAL | 2 refills | Status: DC
Start: 2017-01-30 — End: 2017-03-11

## 2017-01-28 NOTE — Telephone Encounter (Signed)
Patient ok with the change. [facility] [place]

## 2017-02-11 HISTORY — PX: COLONOSCOPY: SHX174

## 2017-02-11 LAB — HM COLONOSCOPY

## 2017-02-12 ENCOUNTER — Encounter: Payer: Self-pay | Admitting: Family Medicine

## 2017-02-12 ENCOUNTER — Ambulatory Visit: Payer: BLUE CROSS/BLUE SHIELD

## 2017-02-12 ENCOUNTER — Ambulatory Visit (INDEPENDENT_AMBULATORY_CARE_PROVIDER_SITE_OTHER): Payer: BLUE CROSS/BLUE SHIELD | Admitting: Family Medicine

## 2017-02-12 VITALS — BP 129/64 | HR 69 | Wt 131.0 lb

## 2017-02-12 DIAGNOSIS — I10 Essential (primary) hypertension: Secondary | ICD-10-CM | POA: Diagnosis not present

## 2017-02-12 NOTE — Progress Notes (Signed)
   Subjective:    Patient ID: Gina Hopkins, female    DOB: 1955/09/03, 61 y.o.   MRN: [id number]  HPI  Gina Hopkins is here for a follow up on blood pressure. She was switched from Lisinopril-HCTZ to just the Lisinopril.   Review of Systems  Constitutional: Negative.   HENT: Negative.   Respiratory: Negative for chest tightness and shortness of breath.   Cardiovascular: Negative for chest pain, palpitations and leg swelling.  Neurological: Negative for dizziness and headaches.       Objective:   Physical Exam        Assessment & Plan:  Hypertension - Blood pressure within normal limits. Patient advised to follow up with PCP for current medical issues as advised.   Blood pressure looks fantastic and is at goal. Continue current regimen and keep regular follow-up with PCP in 6 months.

## 2017-02-12 NOTE — Progress Notes (Signed)
Patient advised of recommendations.  

## 2017-02-26 ENCOUNTER — Encounter: Payer: Self-pay | Admitting: Family Medicine

## 2017-03-11 ENCOUNTER — Other Ambulatory Visit: Payer: Self-pay

## 2017-03-11 MED ORDER — ESTRADIOL 10 MCG VA TABS: 10.0000 ug | ORAL_TABLET | VAGINAL | 0 refills | Status: DC

## 2017-04-21 ENCOUNTER — Encounter: Payer: Self-pay | Admitting: Family Medicine

## 2017-04-21 ENCOUNTER — Ambulatory Visit (INDEPENDENT_AMBULATORY_CARE_PROVIDER_SITE_OTHER): Payer: BLUE CROSS/BLUE SHIELD | Admitting: Family Medicine

## 2017-04-21 DIAGNOSIS — M79604 Pain in right leg: Secondary | ICD-10-CM

## 2017-04-21 DIAGNOSIS — M79605 Pain in left leg: Secondary | ICD-10-CM | POA: Diagnosis not present

## 2017-04-21 DIAGNOSIS — M25512 Pain in left shoulder: Secondary | ICD-10-CM | POA: Diagnosis not present

## 2017-04-21 DIAGNOSIS — M25511 Pain in right shoulder: Secondary | ICD-10-CM | POA: Diagnosis not present

## 2017-04-21 DIAGNOSIS — S161XXA Strain of muscle, fascia and tendon at neck level, initial encounter: Secondary | ICD-10-CM | POA: Diagnosis not present

## 2017-04-21 MED ORDER — CYCLOBENZAPRINE HCL 10 MG PO TABS: 5.0000 mg | ORAL_TABLET | Freq: Every evening | ORAL | 0 refills | Status: DC | PRN
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
## 2017-04-21 NOTE — Patient Instructions (Signed)
Recommend ibuprofen, 3 tabs, 3 times a day for the next 5 days.  After that okay to reduce frequency. Ice frequently for 5-10 minutes in the area that is painful or swollen or hurting. Continue to work on gentle stretches.

## 2017-04-22 ENCOUNTER — Telehealth: Payer: Self-pay | Admitting: Family Medicine
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
## 2017-04-22 NOTE — Telephone Encounter (Signed)
OK for new note to be out until 11/2

## 2017-04-23 ENCOUNTER — Encounter: Payer: Self-pay | Admitting: Family Medicine

## 2017-04-23 NOTE — Telephone Encounter (Signed)
Letter written, pt advised. She will pick up today.

## 2017-05-05 ENCOUNTER — Ambulatory Visit: Payer: BLUE CROSS/BLUE SHIELD | Admitting: Family Medicine

## 2017-05-05 ENCOUNTER — Encounter: Payer: Self-pay | Admitting: Family Medicine

## 2017-05-05 DIAGNOSIS — M545 Low back pain, unspecified: Secondary | ICD-10-CM

## 2017-05-05 DIAGNOSIS — M25551 Pain in right hip: Secondary | ICD-10-CM | POA: Diagnosis not present

## 2017-05-05 DIAGNOSIS — M25512 Pain in left shoulder: Secondary | ICD-10-CM | POA: Diagnosis not present

## 2017-05-05 DIAGNOSIS — M25511 Pain in right shoulder: Secondary | ICD-10-CM | POA: Diagnosis not present
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
## 2017-06-02 ENCOUNTER — Ambulatory Visit: Payer: BLUE CROSS/BLUE SHIELD | Admitting: Family Medicine

## 2018-01-26 ENCOUNTER — Other Ambulatory Visit: Payer: Self-pay | Admitting: Family Medicine

## 2018-01-27 ENCOUNTER — Encounter: Payer: Self-pay | Admitting: Family Medicine

## 2018-02-18 ENCOUNTER — Other Ambulatory Visit: Payer: Self-pay | Admitting: Family Medicine

## 2018-06-05 ENCOUNTER — Encounter: Payer: Self-pay | Admitting: Family Medicine

## 2018-06-05 ENCOUNTER — Ambulatory Visit (INDEPENDENT_AMBULATORY_CARE_PROVIDER_SITE_OTHER): Payer: BLUE CROSS/BLUE SHIELD | Admitting: Family Medicine

## 2018-06-05 VITALS — BP 145/78 | HR 75 | Ht 62.21 in | Wt 126.0 lb

## 2018-06-05 DIAGNOSIS — G43009 Migraine without aura, not intractable, without status migrainosus: Secondary | ICD-10-CM

## 2018-06-05 DIAGNOSIS — R3915 Urgency of urination: Secondary | ICD-10-CM | POA: Diagnosis not present

## 2018-06-05 DIAGNOSIS — I10 Essential (primary) hypertension: Secondary | ICD-10-CM | POA: Diagnosis not present

## 2018-06-05 DIAGNOSIS — N3281 Overactive bladder: Secondary | ICD-10-CM | POA: Diagnosis not present

## 2018-06-05 DIAGNOSIS — Z23 Encounter for immunization: Secondary | ICD-10-CM | POA: Diagnosis not present

## 2018-06-05 LAB — POCT URINALYSIS DIPSTICK
BILIRUBIN UA: NEGATIVE
Blood, UA: NEGATIVE
Glucose, UA: NEGATIVE
Ketones, UA: NEGATIVE
Nitrite, UA: NEGATIVE
PH UA: 5.5 (ref 5.0–8.0)
PROTEIN UA: NEGATIVE
Spec Grav, UA: 1.025 (ref 1.010–1.025)
UROBILINOGEN UA: 1 U/dL

## 2018-06-05 MED ORDER — LISINOPRIL 10 MG PO TABS: 10.0000 mg | ORAL_TABLET | Freq: Every day | ORAL | 1 refills | Status: DC

## 2018-06-05 NOTE — Progress Notes (Signed)
Subjective:    CC: BP, HA  HPI:  Hypertension- Pt denies chest pain, SOB, dizziness, or heart palpitations.  Taking meds as directed w/o problems.  Denies medication side effects.  She actually ran out of blood pressure medication about 3 days ago.  F/U migraines -says overall her headaches are doing really well.  She says she will occasionally get some pain but they are not as severe as they were she said is been a long time since she is had a headache that lasted more than 1 day.  She also complains over the last couple months that she is been having some urgency with urination.  She says she never really had problems before.  She denies any burning or blood in the urine.  She is just noticing that she will suddenly have to go it is difficult to make it to the bathroom in time.  She denies any stress incontinence or no leaking with coughing or sneezing.  Work she typically has to work for 4 hours before she is able to get a bathroom break so that is been a little bit challenging.  BP (!) 151/89   Pulse 75   Ht 5' 2.21" (1.58 m)   Wt 126 lb (57.2 kg)   SpO2 100%   BMI 22.89 kg/m     Allergies  Allergen Reactions  . Hydrochlorothiazide Nausea Only  . Propoxyphene N-Acetaminophen     REACTION: nausea    Past Medical History:  Diagnosis Date  . Hypertension   . Seasonal allergies     Past Surgical History:  Procedure Laterality Date  . ABDOMINAL HYSTERECTOMY  2006   and one ovary  . TUBAL LIGATION      Social History   Socioeconomic History  . Marital status: Married    Spouse name: Not on file  . Number of children: Not on file  . Years of education: Not on file  . Highest education level: Not on file  Occupational History    Comment: [employer]  Social Needs  . Financial resource strain: Not on file  . Food insecurity:    Worry: Not on file    Inability: Not on file  . Transportation needs:    Medical: Not on file    Non-medical: Not on file   Tobacco Use  . Smoking status: Current Every Day Smoker    Packs/day: 0.25  . Smokeless tobacco: Never Used  Substance and Sexual Activity  . Alcohol use: No  . Drug use: No  . Sexual activity: Not on file  Lifestyle  . Physical activity:    Days per week: Not on file    Minutes per session: Not on file  . Stress: Not on file  Relationships  . Social connections:    Talks on phone: Not on file    Gets together: Not on file    Attends religious service: Not on file    Active member of club or organization: Not on file    Attends meetings of clubs or organizations: Not on file    Relationship status: Not on file  . Intimate partner violence:    Fear of current or ex partner: Not on file    Emotionally abused: Not on file    Physically abused: Not on file    Forced sexual activity: Not on file  Other Topics Concern  . Not on file  Social History Narrative   Works at [employer].  Family History  Problem Relation Age of Onset  . Heart attack Father   . Cancer Sister        Br Ca  . Hypertension Sister   . Hypertension Other   . Hypertension Mother     Outpatient Encounter Medications as of 06/05/2018  Medication Sig  . lisinopril (PRINIVIL,ZESTRIL) 10 MG tablet Take 1 tablet (10 mg total) by mouth daily. 15 DAY SUPPLY. MUST MAKE APPT.  . [DISCONTINUED] cyclobenzaprine (FLEXERIL) 10 MG tablet Take 0.5-1 tablets (5-10 mg total) by mouth at bedtime as needed for muscle spasms. (Patient not taking: Reported on 05/05/2017)  . [DISCONTINUED] Estradiol 10 MCG TABS vaginal tablet Place 1 tablet (10 mcg total) vaginally 2 (two) times a week. (Patient not taking: Reported on 05/05/2017)  . [DISCONTINUED] fluticasone (FLONASE) 50 MCG/ACT nasal spray Place 2 sprays into both nostrils daily. (Patient not taking: Reported on 05/05/2017)  . [DISCONTINUED] ipratropium (ATROVENT) 0.03 % nasal spray Place 2 sprays into both nostrils 3 (three) times daily as needed for  rhinitis. (Patient not taking: Reported on 05/05/2017)  . [DISCONTINUED] triamcinolone ointment (KENALOG) 0.5 % Apply 1 application topically daily. (Patient not taking: Reported on 05/05/2017)   No facility-administered encounter medications on file as of 06/05/2018.        Objective:    General: Well Developed, well nourished, and in no acute distress.  Neuro: Alert and oriented x3, extra-ocular muscles intact, sensation grossly intact.  HEENT: Normocephalic, atraumatic  Skin: Warm and dry, no rashes. Cardiac: Regular rate and rhythm, no murmurs rubs or gallops, no lower extremity edema.  Respiratory: Clear to auscultation bilaterally. Not using accessory muscles, speaking in full sentences.   Impression and Recommendations:    HTN -pressure elevated today but she is also been out of her medication for 3 days.  We will make sure that she has refills.  Encouraged her to follow-up in 2 weeks for repeat blood pressure check.  Migraines headaches -are all well controlled.  Not currently on prophylaxis.  Overactive bladder-symptoms most consistent with overactive bladder.  Urinalysis performed today to rule out infection etc.  We discussed options including [place] exercises, frequent bathroom breaks and/or medication.

## 2018-06-07 LAB — URINE CULTURE
MICRO NUMBER:: 91496342
SPECIMEN QUALITY:: ADEQUATE

## 2018-06-19 ENCOUNTER — Ambulatory Visit (INDEPENDENT_AMBULATORY_CARE_PROVIDER_SITE_OTHER): Payer: BLUE CROSS/BLUE SHIELD | Admitting: Family Medicine

## 2018-06-19 VITALS — BP 134/76 | HR 70 | Ht 62.21 in | Wt 126.0 lb

## 2018-06-19 DIAGNOSIS — I10 Essential (primary) hypertension: Secondary | ICD-10-CM | POA: Diagnosis not present

## 2018-06-19 NOTE — Progress Notes (Signed)
Agree with documentation as above.   Savaya Hakes, MD  

## 2018-06-19 NOTE — Progress Notes (Signed)
Patient is here today for a blood pressure check. Patient reports taking lisinopril 10mg  daily. Patient also checks her blood pressure at home daily. Patient's blood pressure today is 134/76 pulse 70. I spoke with provider, and patient will return in 3 months for a follow-up visit with provider. A refill of lisinopril 90 day supply was sent to the pharmacy on 06/05/2018 and patient was notified.   Patient advised to return in 3 months to see PCP. Patient verbalized understanding. No further questions or concerns at this time.

## 2018-07-22 ENCOUNTER — Encounter: Payer: Self-pay | Admitting: Physician Assistant

## 2018-07-22 ENCOUNTER — Ambulatory Visit (INDEPENDENT_AMBULATORY_CARE_PROVIDER_SITE_OTHER): Payer: BLUE CROSS/BLUE SHIELD | Admitting: Physician Assistant

## 2018-07-22 VITALS — BP 140/78 | HR 83 | Temp 99.5°F | Ht 62.0 in | Wt 128.0 lb

## 2018-07-22 DIAGNOSIS — J069 Acute upper respiratory infection, unspecified: Secondary | ICD-10-CM

## 2018-07-22 DIAGNOSIS — R05 Cough: Secondary | ICD-10-CM | POA: Diagnosis not present

## 2018-07-22 DIAGNOSIS — R059 Cough, unspecified: Secondary | ICD-10-CM

## 2018-07-22 DIAGNOSIS — R0981 Nasal congestion: Secondary | ICD-10-CM

## 2018-07-22 LAB — POCT INFLUENZA A/B
INFLUENZA A, POC: NEGATIVE
INFLUENZA B, POC: NEGATIVE

## 2018-07-22 NOTE — Patient Instructions (Signed)
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
## 2018-09-17 ENCOUNTER — Other Ambulatory Visit: Payer: Self-pay

## 2018-09-17 ENCOUNTER — Ambulatory Visit (INDEPENDENT_AMBULATORY_CARE_PROVIDER_SITE_OTHER): Payer: BLUE CROSS/BLUE SHIELD | Admitting: Family Medicine

## 2018-09-17 ENCOUNTER — Encounter: Payer: Self-pay | Admitting: Family Medicine

## 2018-09-17 VITALS — HR 90 | Temp 98.2°F | Wt 128.0 lb

## 2018-09-17 DIAGNOSIS — J301 Allergic rhinitis due to pollen: Secondary | ICD-10-CM | POA: Diagnosis not present

## 2018-09-17 MED ORDER — FLUTICASONE PROPIONATE 50 MCG/ACT NA SUSP: 1.0000 | Freq: Every day | NASAL | 12 refills | Status: DC

## 2018-09-17 NOTE — Progress Notes (Signed)
Unable to reach pt. Tried several times to contact her .Gina Hopkins, [place]

## 2018-09-17 NOTE — Progress Notes (Signed)
Virtual Visit via Video Note  I connected with Gina Hopkins on 09/17/18 at  4:20 PM EDT by a video enabled telemedicine application and verified that I am speaking with the correct person using two identifiers.   I discussed the limitations of evaluation and management by telemedicine and the availability of in person appointments. The patient expressed understanding and agreed to proceed.  Subjective:    CC: ST  HPI:  She has some drainage and sore throat and mild congestion.  She has had some itching in her throat and eye.  Mild cough with smoking cigarette.  Says can go hours without coughing.  Temp 97.6 yesterday. No SOB.  No bodyaches. NO diarrhea.  + mild HA. Using tylenol.  She reports that her blood pressure was in the 197/98.  Past medical history, Surgical history, Family history not pertinant except as noted below, Social history, Allergies, and medications have been entered into the medical record, reviewed, and corrections made.   Review of Systems: No fevers, chills, night sweats, weight loss, chest pain, or shortness of breath.   Objective:    General: No SOB. Speaking easily with no SOB.   Vitals:   09/17/18 1633  BP: (!) 197/98  Pulse: 90  Temp: 98.2 F (36.8 C)     Impression and Recommendations:    Allergic rhinitis - Will refill her flonase.  I think her sxs are consistant with allergies.  I did warn her that if her symptoms get worse to call us back.  I want her to monitor over the weekend for a fever increased cough, or shortness of breath.  But her symptoms seem quite mild and she is afebrile and again most consistent with seasonal allergies which is not uncommon for her this time of year.  Her home blood pressure was quite elevated I want her to monitor that over the weekend.  I do question its accuracy the last couple times she is been here her blood pressure looks great.   I discussed the assessment and treatment plan with the patient. The patient  was provided an opportunity to ask questions and all were answered. The patient agreed with the plan and demonstrated an understanding of the instructions.  Meds ordered this encounter  Medications  . fluticasone (FLONASE) 50 MCG/ACT nasal spray    Sig: Place 1-2 sprays into both nostrils daily.    Dispense:  16 g    Refill:  12      The patient was advised to call back or seek an in-person evaluation if the symptoms worsen or if the condition fails to improve as anticipated.  I provided 13 minutes of non-face-to-face time during this encounter.   Beatrice Lecher, MD

## 2018-09-18 ENCOUNTER — Ambulatory Visit: Payer: BLUE CROSS/BLUE SHIELD | Admitting: Family Medicine

## 2018-10-28 ENCOUNTER — Telehealth: Payer: Self-pay | Admitting: Family Medicine

## 2018-10-28 ENCOUNTER — Ambulatory Visit (INDEPENDENT_AMBULATORY_CARE_PROVIDER_SITE_OTHER): Payer: BLUE CROSS/BLUE SHIELD | Admitting: Family Medicine

## 2018-10-28 ENCOUNTER — Encounter: Payer: Self-pay | Admitting: Family Medicine

## 2018-10-28 VITALS — BP 166/72 | HR 68 | Temp 98.7°F | Ht 62.21 in | Wt 128.0 lb

## 2018-10-28 DIAGNOSIS — R0602 Shortness of breath: Secondary | ICD-10-CM | POA: Diagnosis not present

## 2018-10-28 DIAGNOSIS — I10 Essential (primary) hypertension: Secondary | ICD-10-CM

## 2018-10-28 DIAGNOSIS — F5102 Adjustment insomnia: Secondary | ICD-10-CM | POA: Diagnosis not present

## 2018-10-28 DIAGNOSIS — R0789 Other chest pain: Secondary | ICD-10-CM

## 2018-10-28 DIAGNOSIS — F430 Acute stress reaction: Secondary | ICD-10-CM

## 2018-10-28 MED ORDER — ESCITALOPRAM OXALATE 10 MG PO TABS: ORAL_TABLET | ORAL | 1 refills | Status: DC

## 2018-10-28 MED ORDER — LISINOPRIL 20 MG PO TABS: 20.0000 mg | ORAL_TABLET | Freq: Every day | ORAL | 1 refills | Status: DC

## 2018-10-28 NOTE — Telephone Encounter (Signed)
Spoke w/pt and advised her that since her BP was still elevated Dr. Madilyn Fireman increased her Lisinopril to 20 mg and she should start taking this immediately. Also she would like for her to f/u with either her or come in for a NV for a BP check in 3 wks.   Pt informed me that she had purchased a bp cuff today to keep a check on her blood pressures at home. . I told her that she should keep a log of her BP readings daily and that I would ask Dr. Madilyn Fireman if this would be ok if she does this because she had already scheduled a [facility] appt for 3 wk f/u before leaving the office today.   I told her that I would call her back to let her know what she should do. [name], [place] Crocker, CMA

## 2018-10-28 NOTE — Telephone Encounter (Signed)
Please call pt: her repeat BP was still high today and was a little high last time she was here. I want to increase her lisinopril to 20mg . New rx sent to pharmacy. F/U with nurse visit for BP check or with me in 3 weeks.

## 2018-10-28 NOTE — Progress Notes (Signed)
Subjective:    CC: Hospital follow-up  HPI:  63 year old female with a history of hypertension went to the emergency department at [facility] on May 2 for chest pain, shortness of breath and some nausea.  She reported increased stress and anxiety and lack of sleep for several days.  She been particularly worried about her 91 year old son who is in rehab program in [place].  CMP, CBC troponin, EKG and chest x-ray were all normal and reassuring.  She was given a small quantity of Xanax to use.  She is still having some chest pressure. She says in fact she has been having some mid-chest "heaviness" for about a month now.    More recently. She has had "shooting" pains across her forehead that started Saturday. she said that its mostly over her L eye. she has a slight HA and has pain/pressure behind her eyes and nasal area.  She has been sleeping very poorly.  .  She says her son has been really worrying her. He is smart and graduated from [employer] and moved the [place] for a 3-figure job and now has been hooked on meth.  Has been in and out of rehab.   BP (!) 166/72   Pulse 68   Temp 98.7 F (37.1 C)   Ht 5' 2.21" (1.58 m)   Wt 128 lb (58.1 kg)   SpO2 100%   BMI 23.26 kg/m     Allergies  Allergen Reactions  . Hydrochlorothiazide Nausea Only  . Propoxyphene N-Acetaminophen     REACTION: nausea    Past Medical History:  Diagnosis Date  . Hypertension   . Seasonal allergies     Past Surgical History:  Procedure Laterality Date  . ABDOMINAL HYSTERECTOMY  2006   and one ovary  . TUBAL LIGATION      Social History   Socioeconomic History  . Marital status: Married    Spouse name: Not on file  . Number of children: Not on file  . Years of education: Not on file  . Highest education level: Not on file  Occupational History    Comment: [employer]  Social Needs  . Financial resource strain: Not on file  . Food insecurity:    Worry:  Not on file    Inability: Not on file  . Transportation needs:    Medical: Not on file    Non-medical: Not on file  Tobacco Use  . Smoking status: Current Every Day Smoker    Packs/day: 0.25  . Smokeless tobacco: Never Used  Substance and Sexual Activity  . Alcohol use: No  . Drug use: No  . Sexual activity: Not on file  Lifestyle  . Physical activity:    Days per week: Not on file    Minutes per session: Not on file  . Stress: Not on file  Relationships  . Social connections:    Talks on phone: Not on file    Gets together: Not on file    Attends religious service: Not on file    Active member of club or organization: Not on file    Attends meetings of clubs or organizations: Not on file    Relationship status: Not on file  . Intimate partner violence:    Fear of current or ex partner: Not on file    Emotionally abused: Not on file    Physically abused: Not on file    Forced sexual activity: Not on file  Other Topics  Concern  . Not on file  Social History Narrative   Works at [employer].     Family History  Problem Relation Age of Onset  . Heart attack Father   . Cancer Sister        Br Ca  . Hypertension Sister   . Hypertension Other   . Hypertension Mother     Outpatient Encounter Medications as of 10/28/2018  Medication Sig  . ALPRAZolam (XANAX) 0.25 MG tablet Take by mouth.  . fluticasone (FLONASE) 50 MCG/ACT nasal spray Place 1-2 sprays into both nostrils daily.  Marland Kitchen lisinopril (ZESTRIL) 20 MG tablet Take 1 tablet (20 mg total) by mouth daily.  . [DISCONTINUED] lisinopril (PRINIVIL,ZESTRIL) 10 MG tablet Take 1 tablet (10 mg total) by mouth daily.  Marland Kitchen escitalopram (LEXAPRO) 10 MG tablet 1/2 tab po QD x 6 days then whole tab daily   No facility-administered encounter medications on file as of 10/28/2018.      Review of Systems: No fevers, chills, night sweats, weight loss, chest pain, or shortness of breath.   Objective:    Physical  Exam Constitutional:      Appearance: She is well-developed.  HENT:     Head: Normocephalic and atraumatic.  Cardiovascular:     Rate and Rhythm: Normal rate and regular rhythm.     Heart sounds: Normal heart sounds.  Pulmonary:     Effort: Pulmonary effort is normal.     Breath sounds: Normal breath sounds.  Musculoskeletal:        General: No swelling.  Skin:    General: Skin is warm and dry.  Neurological:     Mental Status: She is alert and oriented to person, place, and time.  Psychiatric:        Behavior: Behavior normal.       Impression and Recommendations:    Atypical chest pain- still there. With negative ED workup I think probably stress related.    Anxiety -discussed possibly putting her on a controller medication and also discussed therapy/counseling as an option with for virtual visits. She is ok with both plans. Will start lexapro. F/U in 3 weeks. Will refer for therapy and counseling.   Insomnia -secondary to increased stress and anxiety.  Shortness of breath-likely secondary to anxiety. Lungs are clear today.   HTN - BP not well controlled today but she is tearful. Will increase lisinopril to 20mg s.      Beatrice Lecher, MD

## 2018-10-28 NOTE — Telephone Encounter (Signed)
Attempted to contact Pt, no answer and VM is full.  

## 2018-10-29 NOTE — Telephone Encounter (Signed)
vm is full will try again later.Marland KitchenMarland KitchenAudelia Hives Hopkins

## 2018-10-29 NOTE — Telephone Encounter (Signed)
Ok to do the virtual appt.

## 2018-10-30 NOTE — Telephone Encounter (Signed)
Pt advised, she said that the lisinopril 20 mg is on backorder. She is taking 2 of the 10 mg. Will inform pcp and advised her that should she need refills to let us know. She said that this was already taken care of for now .Elouise Munroe, [place]

## 2018-11-19 ENCOUNTER — Other Ambulatory Visit: Payer: Self-pay | Admitting: Family Medicine

## 2018-11-19 ENCOUNTER — Ambulatory Visit (INDEPENDENT_AMBULATORY_CARE_PROVIDER_SITE_OTHER): Payer: BLUE CROSS/BLUE SHIELD | Admitting: Family Medicine

## 2018-11-19 ENCOUNTER — Encounter: Payer: Self-pay | Admitting: Family Medicine

## 2018-11-19 VITALS — BP 150/100 | HR 69 | Temp 98.0°F | Ht 62.21 in | Wt 128.0 lb

## 2018-11-19 DIAGNOSIS — I10 Essential (primary) hypertension: Secondary | ICD-10-CM | POA: Diagnosis not present

## 2018-11-19 DIAGNOSIS — R0789 Other chest pain: Secondary | ICD-10-CM | POA: Diagnosis not present

## 2018-11-19 DIAGNOSIS — R928 Other abnormal and inconclusive findings on diagnostic imaging of breast: Secondary | ICD-10-CM

## 2018-11-19 DIAGNOSIS — F430 Acute stress reaction: Secondary | ICD-10-CM

## 2018-11-19 DIAGNOSIS — N644 Mastodynia: Secondary | ICD-10-CM

## 2018-11-19 MED ORDER — LISINOPRIL-HYDROCHLOROTHIAZIDE 10-12.5 MG PO TABS: 1.0000 | ORAL_TABLET | Freq: Every day | ORAL | 1 refills | Status: DC

## 2018-11-19 NOTE — Progress Notes (Signed)
Virtual Visit via Video Note  I connected with Gina Hopkins on 11/19/18 at  9:10 AM EDT by a video enabled telemedicine application and verified that I am speaking with the correct person using two identifiers.   I discussed the limitations of evaluation and management by telemedicine and the availability of in person appointments. The patient expressed understanding and agreed to proceed.  Subjective:    CC: F/U stress and anxiety   HPI: Follow-up acute reaction to stress- 63 year old female with a history of hypertension went to the emergency department at [place] on May 2 for chest pain, shortness of breath and some nausea.  She reported increased stress and anxiety and lack of sleep for several days.  She been particularly worried about her 54 year old son who is in rehab program in [place].  CMP, CBC troponin, EKG and chest x-ray were all normal and reassuring.  She was given a small quantity of Xanax to use. She says her son has been really worrying her. He is smart and graduated from [employer] and moved the [place] for a 3-figure job and now has been hooked on meth.  Has been in and out of rehab.  She was still having some chest pain by the time that I saw her.  After much discussion we decided to focus on her stress and anxiety and start her on Lexapro.  She did also agree to referral to behavioral health for some therapy/counseling.  They attempted to contact her multiple times and were unable to do so so they closed out the referral.  Says she is actually overall feeling much much better.  She was able to get her son to come home from [place] and he is now staying with her.  She is happy with the Lexapro. Denies any side effects.   Follow-up atypical chest pain - she is still having some chest pressure but it has been better this last week. She drank some gingerale and burped and actually felt better.  She wonders if it could have been reflux related.  She  does not currently take any medications for heartburn or reflux.  Headaches-she was also having sharp shooting headaches when I last saw her.  Says these are better as well.  He also notices since being off of her hydrochlorothiazide diuretic after we reduced her blood pressure pill that she does get a little bit more swelling in her hands.  She usually notices it most in the morning.  She has been having bilateral breast soreness and occ sharp pain for several years  Says it happens about one week out of the month. No nipple discharge. Not taking nay hormones or supplements.   Past medical history, Surgical history, Family history not pertinant except as noted below, Social history, Allergies, and medications have been entered into the medical record, reviewed, and corrections made.   Review of Systems: No fevers, chills, night sweats, weight loss, chest pain, or shortness of breath.   Objective:    General: Speaking clearly in complete sentences without any shortness of breath.  Alert and oriented x3.  Normal judgment. No apparent acute distress. Well groomed.     Impression and Recommendations:   Acute reaction to stress- doing well on Lexapro. Her son is back home with her.  We will continue with current regimen for now.  Plan to follow-up in 2 months.  Atypical chest pain- ok to try a PPI if her symptoms recur.  Right now she is  actually gone all week without any chest pressure or discomfort.  I do find it interesting that she did get relief after burping several times.  So I did recommend a trial of a PPI or H2 blocker if her symptoms persist.  Hand swelling -discussed options including restarting a low-dose of hydrochlorothiazide.  She would like to add back her diuretic for her hand swelling.  Prescription sent to pharmacy.  She can call there is any problems or concerns.  Bilateral breast soreness-is actually been going on for several years and has not really changed or gotten  worse.  Her last mammogram was normal but she is overdue for mammogram.  Order placed.  [facility] gets [facility].  We could also consider just checking some hormone levels to make sure that that might not be part of the cause.  It does seem cyclic.     I discussed the assessment and treatment plan with the patient. The patient was provided an opportunity to ask questions and all were answered. The patient agreed with the plan and demonstrated an understanding of the instructions.   The patient was advised to call back or seek an in-person evaluation if the symptoms worsen or if the condition fails to improve as anticipated.   Beatrice Lecher, MD

## 2018-11-19 NOTE — Telephone Encounter (Signed)
Ok to change to 90 day supply

## 2018-11-19 NOTE — Progress Notes (Signed)
Pt reports that her BP's have been fluctuating between 140's./80's She has been taking her readings in the afternoon.  She didn't have her log with her at her at the time when I spoke w/her. Advised that she get this information back to [place].Gina Hopkins, [place]

## 2018-12-05 LAB — CBC WITH DIFFERENTIAL/PLATELET
Absolute Monocytes: 323 cells/uL (ref 200–950)
Basophils Absolute: 9 cells/uL (ref 0–200)
Basophils Relative: 0.2 %
Eosinophils Absolute: 69 cells/uL (ref 15–500)
Eosinophils Relative: 1.6 %
HCT: 39.9 % (ref 35.0–45.0)
Hemoglobin: 13.5 g/dL (ref 11.7–15.5)
Lymphs Abs: 2124 cells/uL (ref 850–3900)
MCH: 30.5 pg (ref 27.0–33.0)
MCHC: 33.8 g/dL (ref 32.0–36.0)
MCV: 90.3 fL (ref 80.0–100.0)
MPV: 13.3 fL — ABNORMAL HIGH (ref 7.5–12.5)
Monocytes Relative: 7.5 %
Neutro Abs: 1776 cells/uL (ref 1500–7800)
Neutrophils Relative %: 41.3 %
Platelets: 217 10*3/uL (ref 140–400)
RBC: 4.42 10*6/uL (ref 3.80–5.10)
RDW: 13.6 % (ref 11.0–15.0)
Total Lymphocyte: 49.4 %
WBC: 4.3 10*3/uL (ref 3.8–10.8)

## 2018-12-05 LAB — TSH: TSH: 1.8 mIU/L (ref 0.40–4.50)

## 2018-12-05 LAB — PROGESTERONE: Progesterone: <0.5 ng/mL

## 2018-12-05 LAB — LUTEINIZING HORMONE: LH: 40 m[IU]/mL

## 2018-12-05 LAB — ESTRADIOL: Estradiol: <15 pg/mL

## 2018-12-05 LAB — FOLLICLE STIMULATING HORMONE: FSH: 136.3 m[IU]/mL — ABNORMAL HIGH

## 2018-12-14 LAB — HM MAMMOGRAPHY

## 2019-01-25 ENCOUNTER — Ambulatory Visit: Payer: BLUE CROSS/BLUE SHIELD | Admitting: Family Medicine

## 2019-02-02 ENCOUNTER — Encounter: Payer: Self-pay | Admitting: Family Medicine

## 2019-02-02 ENCOUNTER — Ambulatory Visit (INDEPENDENT_AMBULATORY_CARE_PROVIDER_SITE_OTHER): Payer: BC Managed Care – PPO | Admitting: Family Medicine

## 2019-02-02 VITALS — BP 118/64 | HR 79 | Ht 62.0 in | Wt 126.0 lb

## 2019-02-02 DIAGNOSIS — F430 Acute stress reaction: Secondary | ICD-10-CM | POA: Diagnosis not present

## 2019-02-02 DIAGNOSIS — I10 Essential (primary) hypertension: Secondary | ICD-10-CM | POA: Diagnosis not present

## 2019-02-02 DIAGNOSIS — R21 Rash and other nonspecific skin eruption: Secondary | ICD-10-CM | POA: Diagnosis not present

## 2019-02-02 MED ORDER — CLOTRIMAZOLE-BETAMETHASONE 1-0.05 % EX CREA: 1.0000 "application " | TOPICAL_CREAM | Freq: Two times a day (BID) | CUTANEOUS | 0 refills | Status: DC

## 2019-02-02 MED ORDER — TRIAMCINOLONE ACETONIDE 0.5 % EX OINT: 1.0000 "application " | TOPICAL_OINTMENT | Freq: Every evening | CUTANEOUS | 0 refills | Status: DC | PRN

## 2019-02-02 NOTE — Progress Notes (Signed)
Established Patient Office Visit  Subjective:  Patient ID: Gina Hopkins, female    DOB: 02-18-1956  Age: 63 y.o. MRN: [id number]  CC:  Chief Complaint  Patient presents with  . mood    doing well she wanted to know if she needed to continue taking the Lexapro  . Hypertension    she reports that she has been monitoring her bp and exercising doing well on current regimen  . Rash    on arms x1 mo and chest x 2 wks. she denies any changes to detergent, diet,lotions,soaps. using benadryl     HPI Gina Hopkins presents for follow-up mood.  She seems to be doing really well and her son is doing well so that has really made a positive impact for her.  She has not had any side effects with the Lexapro but wants to know if she needs to continue to take it or not.  Hypertension- Pt denies chest pain, SOB, dizziness, or heart palpitations.  Taking meds as directed w/o problems.  Denies medication side effects.  She has been trying to get regular exercise as well.  She also complains of a rash that initially started on her left arm and then about a week later showed up on her right arm and now over the last 2 weeks has been on her upper chest.  She denies any recent changes to diet, detergents, soaps etc.  She says oral Benadryl does help.  She had a little bit of old triamcinolone clean cream and says that did not really seem to make a difference.  She normally gets some dry eczematous patches near her elbows for which she typically uses that and is also requesting a refill on that medication.   Past Medical History:  Diagnosis Date  . Hypertension   . Seasonal allergies     Past Surgical History:  Procedure Laterality Date  . ABDOMINAL HYSTERECTOMY  2006   and one ovary  . TUBAL LIGATION      Family History  Problem Relation Age of Onset  . Heart attack Father   . Cancer Sister        Br Ca  . Hypertension Sister   . Hypertension Other   . Hypertension Mother     Social  History   Socioeconomic History  . Marital status: Married    Spouse name: Not on file  . Number of children: Not on file  . Years of education: Not on file  . Highest education level: Not on file  Occupational History    Comment: [employer]  Social Needs  . Financial resource strain: Not on file  . Food insecurity    Worry: Not on file    Inability: Not on file  . Transportation needs    Medical: Not on file    Non-medical: Not on file  Tobacco Use  . Smoking status: Current Every Day Smoker    Packs/day: 0.25  . Smokeless tobacco: Never Used  Substance and Sexual Activity  . Alcohol use: No  . Drug use: No  . Sexual activity: Not on file  Lifestyle  . Physical activity    Days per week: Not on file    Minutes per session: Not on file  . Stress: Not on file  Relationships  . Social Herbalist on phone: Not on file    Gets together: Not on file    Attends religious service: Not on file  Active member of club or organization: Not on file    Attends meetings of clubs or organizations: Not on file    Relationship status: Not on file  . Intimate partner violence    Fear of current or ex partner: Not on file    Emotionally abused: Not on file    Physically abused: Not on file    Forced sexual activity: Not on file  Other Topics Concern  . Not on file  Social History Narrative   Works at [employer].     Outpatient Medications Prior to Visit  Medication Sig Dispense Refill  . escitalopram (LEXAPRO) 10 MG tablet Take 1 tablet (10 mg total) by mouth daily. 90 tablet 0  . fluticasone (FLONASE) 50 MCG/ACT nasal spray Place 1-2 sprays into both nostrils daily. 16 g 12  . lisinopril-hydrochlorothiazide (ZESTORETIC) 10-12.5 MG tablet Take 1 tablet by mouth daily. 90 tablet 1  . lisinopril (ZESTRIL) 40 MG tablet Take 1 tablet by mouth daily.    Marland Kitchen lisinopril (ZESTRIL) 20 MG tablet Take 1 tablet (20 mg total) by mouth daily. 90 tablet 1    No facility-administered medications prior to visit.     Allergies  Allergen Reactions  . Hydrochlorothiazide Nausea Only  . Propoxyphene N-Acetaminophen     REACTION: nausea    ROS Review of Systems    Objective:    Physical Exam  Constitutional: She is oriented to person, place, and time. She appears well-developed and well-nourished.  HENT:  Head: Normocephalic and atraumatic.  Cardiovascular: Normal rate, regular rhythm and normal heart sounds.  Pulmonary/Chest: Effort normal and breath sounds normal.  Neurological: She is alert and oriented to person, place, and time.  Skin: Skin is warm and dry.  On her left forearm she has a few scattered papules and on the posterior upper outer arm she has a much larger erythematous area approximately 4 x 5 cm in size.  She has a few erythematous papules on the right outer arm as well and a few scattered erythematous papules on her upper chest wall.  Psychiatric: She has a normal mood and affect. Her behavior is normal.    BP 118/64   Pulse 79   Ht 5\' 2"  (1.575 m)   Wt 126 lb (57.2 kg)   SpO2 100%   BMI 23.05 kg/m  Wt Readings from Last 3 Encounters:  02/02/19 126 lb (57.2 kg)  11/19/18 128 lb (58.1 kg)  10/28/18 128 lb (58.1 kg)     Health Maintenance Due  Topic Date Due  . MAMMOGRAM  06/21/2018    There are no preventive care reminders to display for this patient.  Lab Results  Component Value Date   TSH 1.80 12/04/2018   Lab Results  Component Value Date   WBC 4.3 12/04/2018   HGB 13.5 12/04/2018   HCT 39.9 12/04/2018   MCV 90.3 12/04/2018   PLT 217 12/04/2018   Lab Results  Component Value Date   NA 140 02/13/2016   K 4.4 02/13/2016   CO2 28 02/13/2016   GLUCOSE 80 02/13/2016   BUN 17 02/13/2016   CREATININE 1.01 02/13/2016   BILITOT 0.8 02/13/2016   ALKPHOS 73 02/13/2016   AST 13 02/13/2016   ALT 8 02/13/2016   PROT 6.4 02/13/2016   ALBUMIN 3.9 02/13/2016   CALCIUM 10.0 02/13/2016   Lab  Results  Component Value Date   CHOL 217 (H) 02/13/2016   Lab Results  Component Value Date   HDL  92 02/13/2016   Lab Results  Component Value Date   LDLCALC 113 02/13/2016   Lab Results  Component Value Date   TRIG 58 02/13/2016   Lab Results  Component Value Date   CHOLHDL 2.4 02/13/2016   No results found for: HGBA1C    Assessment & Plan:   Problem List Items Addressed This Visit      Cardiovascular and Mediastinum   HYPERTENSION, BENIGN    Well controlled. Continue current regimen. Follow up in  56mo        Other   Acute reaction to stress - Primary    We will cut Lexapro in half and take 5 mg daily for 1 month if doing well at that point then we can take her completely off.       Other Visit Diagnoses    Rash and nonspecific skin eruption         Rash-unclear etiology it does seem most allergic in some respects.  Possibly contact dermatitis.  It does not seem to be triggered by the sun though it did not respond well to triamcinolone though it was an old prescription.  I am going to send over prescription for Lotrisone to see if that helps.  Also switch her to a 24-hour acting antihistamine instead of just regular oral Benadryl and see if this resolves in the next week or 2.  Avoid things with additional perfumes dyes etc.  If not improving then please let me know.  Meds ordered this encounter  Medications  . triamcinolone ointment (KENALOG) 0.5 %    Sig: Apply 1 application topically at bedtime as needed.    Dispense:  45 g    Refill:  0  . clotrimazole-betamethasone (LOTRISONE) cream    Sig: Apply 1 application topically 2 (two) times daily.    Dispense:  45 g    Refill:  0    Follow-up: Return in about 6 months (around 08/05/2019) for Hypertension.    Beatrice Lecher, MD

## 2019-02-02 NOTE — Assessment & Plan Note (Signed)
We will cut Lexapro in half and take 5 mg daily for 1 month if doing well at that point then we can take her completely off.

## 2019-02-02 NOTE — Patient Instructions (Addendum)
Recommend try claritin or zyrtec or Allegra which can last 2 hours.  Call if rash is not better in one week.   Ok to split the lexapro in half and take half a tab daily. If you want to come completely off in one month then let me know.

## 2019-02-02 NOTE — Assessment & Plan Note (Signed)
Well controlled. Continue current regimen. Follow up in  6 mo  

## 2019-02-12 ENCOUNTER — Other Ambulatory Visit: Payer: Self-pay | Admitting: Family Medicine

## 2019-04-29 ENCOUNTER — Other Ambulatory Visit: Payer: Self-pay | Admitting: Family Medicine

## 2019-04-29 DIAGNOSIS — I10 Essential (primary) hypertension: Secondary | ICD-10-CM

## 2019-05-27 ENCOUNTER — Other Ambulatory Visit: Payer: Self-pay | Admitting: Family Medicine

## 2019-05-27 DIAGNOSIS — I10 Essential (primary) hypertension: Secondary | ICD-10-CM

## 2019-06-03 ENCOUNTER — Encounter: Payer: Self-pay | Admitting: Family Medicine

## 2019-08-05 ENCOUNTER — Encounter: Payer: Self-pay | Admitting: Family Medicine

## 2019-08-05 ENCOUNTER — Ambulatory Visit (INDEPENDENT_AMBULATORY_CARE_PROVIDER_SITE_OTHER): Payer: BC Managed Care – PPO | Admitting: Family Medicine

## 2019-08-05 ENCOUNTER — Other Ambulatory Visit: Payer: Self-pay

## 2019-08-05 VITALS — BP 116/54 | HR 72 | Ht 62.0 in | Wt 134.0 lb

## 2019-08-05 DIAGNOSIS — F430 Acute stress reaction: Secondary | ICD-10-CM

## 2019-08-05 DIAGNOSIS — I10 Essential (primary) hypertension: Secondary | ICD-10-CM

## 2019-08-05 DIAGNOSIS — F5101 Primary insomnia: Secondary | ICD-10-CM

## 2019-08-05 LAB — COMPLETE METABOLIC PANEL WITH GFR
AG Ratio: 1.6 (calc) (ref 1.0–2.5)
ALT: 9 U/L (ref 6–29)
AST: 14 U/L (ref 10–35)
Albumin: 4.1 g/dL (ref 3.6–5.1)
Alkaline phosphatase (APISO): 61 U/L (ref 37–153)
BUN: 16 mg/dL (ref 7–25)
CO2: 32 mmol/L (ref 20–32)
Calcium: 9.7 mg/dL (ref 8.6–10.4)
Chloride: 101 mmol/L (ref 98–110)
Creat: 0.95 mg/dL (ref 0.50–0.99)
GFR, Est African American: 74 mL/min/{1.73_m2} (ref >=60)
GFR, Est Non African American: 64 mL/min/{1.73_m2} (ref >=60)
Globulin: 2.5 g/dL (calc) (ref 1.9–3.7)
Glucose, Bld: 90 mg/dL (ref 65–99)
Potassium: 3.7 mmol/L (ref 3.5–5.3)
Sodium: 138 mmol/L (ref 135–146)
Total Bilirubin: 0.7 mg/dL (ref 0.2–1.2)
Total Protein: 6.6 g/dL (ref 6.1–8.1)

## 2019-08-05 LAB — LIPID PANEL
Cholesterol: 240 mg/dL — ABNORMAL HIGH (ref <200)
HDL: 72 mg/dL (ref >=50)
LDL Cholesterol (Calc): 152 mg/dL (calc) — ABNORMAL HIGH
Non-HDL Cholesterol (Calc): 168 mg/dL (calc) — ABNORMAL HIGH (ref <130)
Total CHOL/HDL Ratio: 3.3 (calc) (ref <5.0)
Triglycerides: 63 mg/dL (ref <150)

## 2019-08-05 NOTE — Assessment & Plan Note (Signed)
Doing really well.  Off of Lexapro.  Will remove from medication list.  We can always restart it if any point she feels like it would be helpful.

## 2019-08-05 NOTE — Patient Instructions (Signed)

## 2019-08-05 NOTE — Progress Notes (Signed)
Established Patient Office Visit  Subjective:  Patient ID: Gina Hopkins, female    DOB: 28-Oct-1955  Age: 64 y.o. MRN: [id number]  CC:  Chief Complaint  Patient presents with  . Hypertension    HPI Nihal Galloza Glace presents for   Hypertension- Pt denies chest pain, SOB, dizziness, or heart palpitations.  Taking meds as directed w/o problems.  Denies medication side effects.    Follow-up on stress-she went herself off the Lexapro and feels like overall she is doing well.  She is now retired and no longer working.  She takes care of one of her grandchildren a couple days a week.  She still having a lot of trouble with sleep.  Still struggling with sleep having a hard time falling asleep and staying asleep.  She says she tries to use Benadryl occasionally but just does not really seem to help she is tried some of her over-the-counter's as well.  She does drink caffeine often times after dinner and wonders if that could even be contributing.  And since retiring her bedtime and wake time have changed as well.  Also her husband snores every night.  She says it is gotten worse as he is gotten older she is tried to talk to him about getting evaluated and sometimes ends up sleeping in another bedroom in the house because of the snoring.  Past Medical History:  Diagnosis Date  . Hypertension   . Seasonal allergies     Past Surgical History:  Procedure Laterality Date  . ABDOMINAL HYSTERECTOMY  2006   and one ovary  . TUBAL LIGATION      Family History  Problem Relation Age of Onset  . Heart attack Father   . Cancer Sister        Br Ca  . Hypertension Sister   . Hypertension Other   . Hypertension Mother     Social History   Socioeconomic History  . Marital status: Married    Spouse name: Not on file  . Number of children: Not on file  . Years of education: Not on file  . Highest education level: Not on file  Occupational History    Comment: [employer]   Tobacco Use  . Smoking status: Current Every Day Smoker    Packs/day: 0.25  . Smokeless tobacco: Never Used  Substance and Sexual Activity  . Alcohol use: No  . Drug use: No  . Sexual activity: Not on file  Other Topics Concern  . Not on file  Social History Narrative   Works at [employer].    Social Determinants of Health   Financial Resource Strain:   . Difficulty of Paying Living Expenses: Not on file  Food Insecurity:   . Worried About Charity fundraiser in the Last Year: Not on file  . Ran Out of Food in the Last Year: Not on file  Transportation Needs:   . Lack of Transportation (Medical): Not on file  . Lack of Transportation (Non-Medical): Not on file  Physical Activity:   . Days of Exercise per Week: Not on file  . Minutes of Exercise per Session: Not on file  Stress:   . Feeling of Stress : Not on file  Social Connections:   . Frequency of Communication with Friends and Family: Not on file  . Frequency of Social Gatherings with Friends and Family: Not on file  . Attends Religious Services: Not on file  . Active Member of  Clubs or Organizations: Not on file  . Attends Archivist Meetings: Not on file  . Marital Status: Not on file  Intimate Partner Violence:   . Fear of Current or Ex-Partner: Not on file  . Emotionally Abused: Not on file  . Physically Abused: Not on file  . Sexually Abused: Not on file    Outpatient Medications Prior to Visit  Medication Sig Dispense Refill  . fluticasone (FLONASE) 50 MCG/ACT nasal spray Place 1-2 sprays into both nostrils daily. 16 g 12  . lisinopril-hydrochlorothiazide (ZESTORETIC) 10-12.5 MG tablet TAKE 1 TABLET BY MOUTH EVERY DAY 90 tablet 1  . clotrimazole-betamethasone (LOTRISONE) cream Apply 1 application topically 2 (two) times daily. 45 g 0  . escitalopram (LEXAPRO) 10 MG tablet TAKE 1 TABLET BY MOUTH EVERY DAY 90 tablet 0  . triamcinolone ointment (KENALOG) 0.5 % Apply 1 application  topically at bedtime as needed. 45 g 0   No facility-administered medications prior to visit.    Allergies  Allergen Reactions  . Hydrochlorothiazide Nausea Only  . Propoxyphene N-Acetaminophen     REACTION: nausea    ROS Review of Systems    Objective:    Physical Exam  Constitutional: She is oriented to person, place, and time. She appears well-developed and well-nourished.  HENT:  Head: Normocephalic and atraumatic.  Cardiovascular: Normal rate, regular rhythm and normal heart sounds.  Pulmonary/Chest: Effort normal and breath sounds normal.  Neurological: She is alert and oriented to person, place, and time.  Skin: Skin is warm and dry.  Psychiatric: She has a normal mood and affect. Her behavior is normal.    BP (!) 116/54   Pulse 72   Ht 5\' 2"  (1.575 m)   Wt 134 lb (60.8 kg)   SpO2 100%   BMI 24.51 kg/m  Wt Readings from Last 3 Encounters:  08/05/19 134 lb (60.8 kg)  02/02/19 126 lb (57.2 kg)  11/19/18 128 lb (58.1 kg)     Health Maintenance Due  Topic Date Due  . MAMMOGRAM  06/25/2018  . TETANUS/TDAP  07/06/2019    There are no preventive care reminders to display for this patient.  Lab Results  Component Value Date   TSH 1.80 12/04/2018   Lab Results  Component Value Date   WBC 4.3 12/04/2018   HGB 13.5 12/04/2018   HCT 39.9 12/04/2018   MCV 90.3 12/04/2018   PLT 217 12/04/2018   Lab Results  Component Value Date   NA 140 02/13/2016   K 4.4 02/13/2016   CO2 28 02/13/2016   GLUCOSE 80 02/13/2016   BUN 17 02/13/2016   CREATININE 1.01 02/13/2016   BILITOT 0.8 02/13/2016   ALKPHOS 73 02/13/2016   AST 13 02/13/2016   ALT 8 02/13/2016   PROT 6.4 02/13/2016   ALBUMIN 3.9 02/13/2016   CALCIUM 10.0 02/13/2016   Lab Results  Component Value Date   CHOL 217 (H) 02/13/2016   Lab Results  Component Value Date   HDL 92 02/13/2016   Lab Results  Component Value Date   LDLCALC 113 02/13/2016   Lab Results  Component Value Date    TRIG 58 02/13/2016   Lab Results  Component Value Date   CHOLHDL 2.4 02/13/2016   No results found for: HGBA1C    Assessment & Plan:   Problem List Items Addressed This Visit      Cardiovascular and Mediastinum   HYPERTENSION, BENIGN - Primary    Well controlled. Continue current regimen. Follow up  in  6 mo       Relevant Orders   COMPLETE METABOLIC PANEL WITH GFR   Lipid panel     Other   Acute reaction to stress    Doing really well.  Off of Lexapro.  Will remove from medication list.  We can always restart it if any point she feels like it would be helpful.       Other Visit Diagnoses    Primary insomnia         Insomnia-it sounds like there is multiple things contributing.  We did discuss avoiding caffeine at all after breakfast time.  In trying that for at least 3 weeks to see if it is helpful.  We also discussed try to set a consistent bedtime and wake time in the mornings to help with circadian rhythms.  And also really speaking with her husband about getting tested for sleep apnea.  She may just need to sleep in another room of the house.  But we will put her on a sedative medication just to keep her asleep because of her husband's snoring which is probably highly likely contributing to a lot of the insomnia.  No orders of the defined types were placed in this encounter.   Follow-up: No follow-ups on file.    Beatrice Lecher, MD

## 2019-08-05 NOTE — Assessment & Plan Note (Addendum)
Well controlled. Continue current regimen. Follow up in  6 mo  

## 2019-10-12 ENCOUNTER — Encounter: Payer: Self-pay | Admitting: Family Medicine

## 2019-10-12 ENCOUNTER — Other Ambulatory Visit: Payer: Self-pay

## 2019-10-12 ENCOUNTER — Ambulatory Visit (INDEPENDENT_AMBULATORY_CARE_PROVIDER_SITE_OTHER): Payer: BC Managed Care – PPO | Admitting: Family Medicine

## 2019-10-12 VITALS — BP 114/61 | HR 77 | Ht 62.0 in | Wt 135.0 lb

## 2019-10-12 DIAGNOSIS — R519 Headache, unspecified: Secondary | ICD-10-CM

## 2019-10-12 DIAGNOSIS — L0291 Cutaneous abscess, unspecified: Secondary | ICD-10-CM

## 2019-10-12 DIAGNOSIS — Z23 Encounter for immunization: Secondary | ICD-10-CM | POA: Diagnosis not present

## 2019-10-12 DIAGNOSIS — J3489 Other specified disorders of nose and nasal sinuses: Secondary | ICD-10-CM | POA: Diagnosis not present

## 2019-10-12 DIAGNOSIS — Z1231 Encounter for screening mammogram for malignant neoplasm of breast: Secondary | ICD-10-CM

## 2019-10-12 MED ORDER — DOXYCYCLINE HYCLATE 100 MG PO TABS: 100.0000 mg | ORAL_TABLET | Freq: Two times a day (BID) | ORAL | 0 refills | Status: DC

## 2019-10-12 MED ORDER — PREDNISONE 20 MG PO TABS: 40.0000 mg | ORAL_TABLET | Freq: Every day | ORAL | 0 refills | Status: DC

## 2019-10-12 NOTE — Progress Notes (Signed)
Established Patient Office Visit  Subjective:  Patient ID: Gina Hopkins, female    DOB: [birth year]  Age: 64 y.o. MRN: [id number]  CC: No chief complaint on file.   HPI Gina Hopkins presents for lump under the left axilla for about 2 weeks she says it is quite tender to touch.  No fevers chills or sweats.  She has not had this before.  She does not remember any significant trauma or irritation.  She knows she is due for her mammogram she says she goes to [facility].  The last one we have on file was 2018 but she actually thinks she did get one in 2019.  She also complains of daily headaches that has been going on for several weeks basically since the weather change and the pollen really increased in the local area she has been getting a lot of pain and pressure right over her nasal bridge.  She says she is been using her Flonase regularly.  She says it always seems worse in the morning.  She denies any significant congestion maybe just a little bit coming and going.  She says the air conditioning at work has not been working but she has been running a humidifier and that seems to provide some relief.  Past Medical History:  Diagnosis Date  . Hypertension   . Seasonal allergies     Past Surgical History:  Procedure Laterality Date  . ABDOMINAL HYSTERECTOMY  2006   and one ovary  . TUBAL LIGATION      Family History  Problem Relation Age of Onset  . Heart attack Father   . Cancer Sister        Br Ca  . Hypertension Sister   . Hypertension Other   . Hypertension Mother     Social History   Socioeconomic History  . Marital status: Married    Spouse name: Not on file  . Number of children: Not on file  . Years of education: Not on file  . Highest education level: Not on file  Occupational History    Comment: [employer]  Tobacco Use  . Smoking status: Current Every Day Smoker    Packs/day: 0.25  . Smokeless tobacco: Never Used  Substance and  Sexual Activity  . Alcohol use: No  . Drug use: No  . Sexual activity: Not on file  Other Topics Concern  . Not on file  Social History Narrative   Works at [employer].    Social Determinants of Health   Financial Resource Strain:   . Difficulty of Paying Living Expenses:   Food Insecurity:   . Worried About Charity fundraiser in the Last Year:   . Arboriculturist in the Last Year:   Transportation Needs:   . Film/video editor (Medical):   Marland Kitchen Lack of Transportation (Non-Medical):   Physical Activity:   . Days of Exercise per Week:   . Minutes of Exercise per Session:   Stress:   . Feeling of Stress :   Social Connections:   . Frequency of Communication with Friends and Family:   . Frequency of Social Gatherings with Friends and Family:   . Attends Religious Services:   . Active Member of Clubs or Organizations:   . Attends Archivist Meetings:   Marland Kitchen Marital Status:   Intimate Partner Violence:   . Fear of Current or Ex-Partner:   . Emotionally Abused:   Marland Kitchen Physically Abused:   .  Sexually Abused:     Outpatient Medications Prior to Visit  Medication Sig Dispense Refill  . fluticasone (FLONASE) 50 MCG/ACT nasal spray Place 1-2 sprays into both nostrils daily. 16 g 12  . lisinopril-hydrochlorothiazide (ZESTORETIC) 10-12.5 MG tablet TAKE 1 TABLET BY MOUTH EVERY DAY 90 tablet 1   No facility-administered medications prior to visit.    Allergies  Allergen Reactions  . Hydrochlorothiazide Nausea Only  . Propoxyphene N-Acetaminophen     REACTION: nausea    ROS Review of Systems    Objective:    Physical Exam  Constitutional: She is oriented to person, place, and time. She appears well-developed and well-nourished.  HENT:  Head: Normocephalic and atraumatic.  Right Ear: External ear normal.  Left Ear: External ear normal.  Nose: Nose normal.  Mouth/Throat: Oropharynx is clear and moist.  TMs and canals are clear.   Eyes: Pupils are  equal, round, and reactive to light. Conjunctivae and EOM are normal.  Neck: No thyromegaly present.  Cardiovascular: Normal rate, regular rhythm and normal heart sounds.  Pulmonary/Chest: Effort normal and breath sounds normal. She has no wheezes.  Musculoskeletal:     Cervical back: Neck supple.  Lymphadenopathy:    She has no cervical adenopathy.  Neurological: She is alert and oriented to person, place, and time.  Skin: Skin is warm and dry.  2.5 - 3 cm erythematous palpable nodule in the left axilla.  No open wound or drainage.  Psychiatric: She has a normal mood and affect.    BP 114/61   Pulse 77   Ht 5\' 2"  (1.575 m)   Wt 135 lb (61.2 kg)   SpO2 100%   BMI 24.69 kg/m  Wt Readings from Last 3 Encounters:  10/12/19 135 lb (61.2 kg)  08/05/19 134 lb (60.8 kg)  02/02/19 126 lb (57.2 kg)     There are no preventive care reminders to display for this patient.  There are no preventive care reminders to display for this patient.  Lab Results  Component Value Date   TSH 1.80 12/04/2018   Lab Results  Component Value Date   WBC 4.3 12/04/2018   HGB 13.5 12/04/2018   HCT 39.9 12/04/2018   MCV 90.3 12/04/2018   PLT 217 12/04/2018   Lab Results  Component Value Date   NA 138 08/05/2019   K 3.7 08/05/2019   CO2 32 08/05/2019   GLUCOSE 90 08/05/2019   BUN 16 08/05/2019   CREATININE 0.95 08/05/2019   BILITOT 0.7 08/05/2019   ALKPHOS 73 02/13/2016   AST 14 08/05/2019   ALT 9 08/05/2019   PROT 6.6 08/05/2019   ALBUMIN 3.9 02/13/2016   CALCIUM 9.7 08/05/2019   Lab Results  Component Value Date   CHOL 240 (H) 08/05/2019   Lab Results  Component Value Date   HDL 72 08/05/2019   Lab Results  Component Value Date   LDLCALC 152 (H) 08/05/2019   Lab Results  Component Value Date   TRIG 63 08/05/2019   Lab Results  Component Value Date   CHOLHDL 3.3 08/05/2019   No results found for: HGBA1C    Assessment & Plan:   Problem List Items Addressed This  Visit    None    Visit Diagnoses    Need for tetanus, diphtheria, and acellular pertussis (Tdap) vaccine in patient of adolescent age or older    -  Primary   Relevant Orders   Tdap vaccine greater than or equal to 7yo IM (Completed)  Screening mammogram, encounter for       Relevant Orders   MM 3D SCREEN BREAST BILATERAL   Abscess       Relevant Medications   doxycycline (VIBRA-TABS) 100 MG tablet   Headache, chronic daily       Relevant Medications   predniSONE (DELTASONE) 20 MG tablet   Sinus pain       Relevant Medications   predniSONE (DELTASONE) 20 MG tablet      Tetanus updated today.  Mammogram ordered for screening.  Lesion under the left axilla-most consistent with abscess.  We will try to treat with oral antibiotics if not improving will need I&D and discussed with patient today.  We will treat with doxycycline.  Sinus pain and pressure/daily headache over the nasal bridge.  No significant nasal discharge to suspect infection but will treat with prednisone to see if we can break the headache cycle.  Continue to use her Flonase and continue to use her humidifier.  If not improving over the next week then please let us know.    Meds ordered this encounter  Medications  . doxycycline (VIBRA-TABS) 100 MG tablet    Sig: Take 1 tablet (100 mg total) by mouth 2 (two) times daily.    Dispense:  20 tablet    Refill:  0  . predniSONE (DELTASONE) 20 MG tablet    Sig: Take 2 tablets (40 mg total) by mouth daily with breakfast.    Dispense:  10 tablet    Refill:  0    Follow-up: Return in about 2 weeks (around 10/26/2019) for recheck lesion in axilla.    Beatrice Lecher, MD

## 2019-10-13 ENCOUNTER — Encounter: Payer: Self-pay | Admitting: Family Medicine

## 2019-10-18 ENCOUNTER — Other Ambulatory Visit: Payer: Self-pay | Admitting: Family Medicine

## 2019-10-18 DIAGNOSIS — I10 Essential (primary) hypertension: Secondary | ICD-10-CM

## 2019-10-26 ENCOUNTER — Ambulatory Visit: Payer: BC Managed Care – PPO | Admitting: Family Medicine

## 2019-11-29 ENCOUNTER — Other Ambulatory Visit: Payer: Self-pay | Admitting: Family Medicine

## 2019-12-01 ENCOUNTER — Encounter: Payer: Self-pay | Admitting: Family Medicine

## 2019-12-01 ENCOUNTER — Other Ambulatory Visit: Payer: Self-pay

## 2019-12-01 ENCOUNTER — Ambulatory Visit (INDEPENDENT_AMBULATORY_CARE_PROVIDER_SITE_OTHER): Payer: BC Managed Care – PPO | Admitting: Family Medicine

## 2019-12-01 ENCOUNTER — Other Ambulatory Visit: Payer: Self-pay | Admitting: Family Medicine

## 2019-12-01 VITALS — BP 131/74 | HR 76 | Ht 62.0 in | Wt 134.0 lb

## 2019-12-01 DIAGNOSIS — Z803 Family history of malignant neoplasm of breast: Secondary | ICD-10-CM | POA: Diagnosis not present

## 2019-12-01 DIAGNOSIS — L723 Sebaceous cyst: Secondary | ICD-10-CM

## 2019-12-01 NOTE — Assessment & Plan Note (Signed)
Discussed concern. She is high risk. Recommend Myriad genetic screening.  Form completed and blood drawn today.  Discussed risk calculation to better provide prevention and screening for BrCa.

## 2019-12-01 NOTE — Progress Notes (Signed)
Acute Office Visit  Subjective:    Patient ID: Gina Hopkins, female    DOB: 1955/10/18, 64 y.o.   MRN: [id number]  Chief Complaint  Patient presents with  . lump in arm    L Axilla    HPI Patient is in today for lump in the arm in the left axilla.  She initially presented on April 20 for a lump under the left axilla that have been present for about 2 weeks at that time she complained it was tender to touch.  She was prescribed doxycycline. The lesion improved significantly.  Since then has  Had one more episode of the are swelling and becoming tender. Put abx ointment and compresses. Says it finally went down but never drained.    Says it is smaller but it still there and occasionally is a little itchy.  She is also concerned about her breast health.  Sister dx with BrCa at age 22 and died 56 from [place]. Older sister, no w 46 just dx with bilat BrCa.  She is due for routine mammo this month but was told bye scheduler if had lesion to speak with MD first.    Past Medical History:  Diagnosis Date  . Hypertension   . Seasonal allergies     Past Surgical History:  Procedure Laterality Date  . ABDOMINAL HYSTERECTOMY  2006   and one ovary  . TUBAL LIGATION      Family History  Problem Relation Age of Onset  . Heart attack Father   . Cancer Sister        Br Ca  . Hypertension Sister   . Hypertension Other   . Hypertension Mother     Social History   Socioeconomic History  . Marital status: Married    Spouse name: Not on file  . Number of children: Not on file  . Years of education: Not on file  . Highest education level: Not on file  Occupational History    Comment: [employer]  Tobacco Use  . Smoking status: Current Every Day Smoker    Packs/day: 0.25  . Smokeless tobacco: Never Used  Substance and Sexual Activity  . Alcohol use: No  . Drug use: No  . Sexual activity: Not on file  Other Topics Concern  . Not on file  Social History Narrative    Works at [employer].    Social Determinants of Health   Financial Resource Strain:   . Difficulty of Paying Living Expenses:   Food Insecurity:   . Worried About Charity fundraiser in the Last Year:   . Arboriculturist in the Last Year:   Transportation Needs:   . Film/video editor (Medical):   Marland Kitchen Lack of Transportation (Non-Medical):   Physical Activity:   . Days of Exercise per Week:   . Minutes of Exercise per Session:   Stress:   . Feeling of Stress :   Social Connections:   . Frequency of Communication with Friends and Family:   . Frequency of Social Gatherings with Friends and Family:   . Attends Religious Services:   . Active Member of Clubs or Organizations:   . Attends Archivist Meetings:   Marland Kitchen Marital Status:   Intimate Partner Violence:   . Fear of Current or Ex-Partner:   . Emotionally Abused:   Marland Kitchen Physically Abused:   . Sexually Abused:     Outpatient Medications Prior to Visit  Medication  Sig Dispense Refill  . fluticasone (FLONASE) 50 MCG/ACT nasal spray PLACE 1-2 SPRAYS INTO BOTH NOSTRILS DAILY. 48 mL 4  . lisinopril-hydrochlorothiazide (ZESTORETIC) 10-12.5 MG tablet TAKE 1 TABLET BY MOUTH EVERY DAY 90 tablet 1  . triamcinolone ointment (KENALOG) 0.5 % APPLY 1 APPLICATION TOPICALLY AT BEDTIME AS NEEDED. 45 g 0  . doxycycline (VIBRA-TABS) 100 MG tablet Take 1 tablet (100 mg total) by mouth 2 (two) times daily. 20 tablet 0  . predniSONE (DELTASONE) 20 MG tablet Take 2 tablets (40 mg total) by mouth daily with breakfast. 10 tablet 0   No facility-administered medications prior to visit.    Allergies  Allergen Reactions  . Hydrochlorothiazide Nausea Only  . Propoxyphene N-Acetaminophen     REACTION: nausea    Review of Systems     Objective:    Physical Exam Vitals reviewed.  Constitutional:      Appearance: She is well-developed.  HENT:     Head: Normocephalic and atraumatic.  Eyes:     Conjunctiva/sclera:  Conjunctivae normal.  Cardiovascular:     Rate and Rhythm: Normal rate.  Pulmonary:     Effort: Pulmonary effort is normal.  Skin:    General: Skin is dry.     Coloration: Skin is not pale.     Comments: Almost linear lesion in the left axilla that is swollen. No active drainage. No erythema or tenderness.   Neurological:     Mental Status: She is alert and oriented to person, place, and time.  Psychiatric:        Behavior: Behavior normal.     BP 131/74   Pulse 76   Ht 5' 2" (1.575 m)   Wt 134 lb (60.8 kg)   SpO2 100%   BMI 24.51 kg/m  Wt Readings from Last 3 Encounters:  12/01/19 134 lb (60.8 kg)  10/12/19 135 lb (61.2 kg)  08/05/19 134 lb (60.8 kg)    There are no preventive care reminders to display for this patient.  There are no preventive care reminders to display for this patient.   Lab Results  Component Value Date   TSH 1.80 12/04/2018   Lab Results  Component Value Date   WBC 4.3 12/04/2018   HGB 13.5 12/04/2018   HCT 39.9 12/04/2018   MCV 90.3 12/04/2018   PLT 217 12/04/2018   Lab Results  Component Value Date   NA 138 08/05/2019   K 3.7 08/05/2019   CO2 32 08/05/2019   GLUCOSE 90 08/05/2019   BUN 16 08/05/2019   CREATININE 0.95 08/05/2019   BILITOT 0.7 08/05/2019   ALKPHOS 73 02/13/2016   AST 14 08/05/2019   ALT 9 08/05/2019   PROT 6.6 08/05/2019   ALBUMIN 3.9 02/13/2016   CALCIUM 9.7 08/05/2019   Lab Results  Component Value Date   CHOL 240 (H) 08/05/2019   Lab Results  Component Value Date   HDL 72 08/05/2019   Lab Results  Component Value Date   LDLCALC 152 (H) 08/05/2019   Lab Results  Component Value Date   TRIG 63 08/05/2019   Lab Results  Component Value Date   CHOLHDL 3.3 08/05/2019   No results found for: HGBA1C     Assessment & Plan:   Problem List Items Addressed This Visit      Other   Family history of breast cancer - Primary    Discussed concern. She is high risk. Recommend Myriad genetic screening.   Form completed and blood drawn today.    Discussed risk calculation to better provide prevention and screening for BrCa.        Relevant Orders   US BREAST COMPLETE UNI LEFT INC AXILLA    Other Visit Diagnoses    Sebaceous cyst       Relevant Orders   MM DIAG BREAST TOMO BILATERAL   US BREAST COMPLETE UNI LEFT INC AXILLA   Ambulatory referral to General Surgery      Axillary lesion  Most consistent with seb cyst based on history and exam but feel additional exam is warranted with fam hx.  Will order diag mammo and [place]. Discussed need for full excision. Refer placed to Gen Surgery.   No orders of the defined types were placed in this encounter.    Beatrice Lecher, MD

## 2019-12-17 ENCOUNTER — Telehealth: Payer: Self-pay | Admitting: Family Medicine

## 2019-12-17 NOTE — Telephone Encounter (Signed)
Please call patient and let her know that I did receive her genetic testing results back.  Great news she does not have any mutations that put her at increased risk for breast cancer. Her overall lifetime risk of risk breast cancer is around 7.7% so just encouraged her to make sure she is getting her yearly mammograms.  And if at any point she notices any abnormalities in the breast tissue please come in sooner to have it evaluated.  Because her risk is relatively low they did not recommend any additional intervention such as breast MRI.  This is fantastic news.  She is welcome to pick up a copy of her report.

## 2019-12-20 NOTE — Telephone Encounter (Signed)
Task completed. Pt has been updated of genetic testing results. Agreeable with follow up plan for yearly mammogram. No other inquiries during the call.

## 2019-12-28 ENCOUNTER — Telehealth: Payer: Self-pay

## 2019-12-28 LAB — HM MAMMOGRAPHY

## 2019-12-28 NOTE — Telephone Encounter (Signed)
Pt called stating that the lump she had under her armpit has gone away. Pt is scheduled to have a surgical procedure to remove the lump on 01/06/20. Pt wants to know whether she should proceed with surgical procedure or cancel since the lump is no longer present? Pls advise, thanks.

## 2019-12-29 NOTE — Telephone Encounter (Signed)
Patient advised of recommendations.  

## 2019-12-29 NOTE — Telephone Encounter (Signed)
I do think the lesion was a sebaceous cyst in which case they can change in size they can get a little bigger when they are inflamed and then get a little smaller but they can come back.  If she still able to feel the spot, even if it smaller than I would recommend that she go ahead and have it removed because it can become inflamed again.  If it is completely gone and she cannot feel anything then she may want to call their office and see what they would recommend.

## 2020-02-01 ENCOUNTER — Encounter: Payer: Self-pay | Admitting: Family Medicine

## 2020-02-02 ENCOUNTER — Ambulatory Visit: Payer: BC Managed Care – PPO | Admitting: Family Medicine

## 2020-02-09 ENCOUNTER — Encounter: Payer: Self-pay | Admitting: Family Medicine

## 2020-02-09 ENCOUNTER — Ambulatory Visit (INDEPENDENT_AMBULATORY_CARE_PROVIDER_SITE_OTHER): Payer: BC Managed Care – PPO | Admitting: Family Medicine

## 2020-02-09 ENCOUNTER — Other Ambulatory Visit: Payer: Self-pay

## 2020-02-09 VITALS — BP 108/80 | HR 80 | Ht 62.0 in | Wt 127.0 lb

## 2020-02-09 DIAGNOSIS — R630 Anorexia: Secondary | ICD-10-CM

## 2020-02-09 DIAGNOSIS — E785 Hyperlipidemia, unspecified: Secondary | ICD-10-CM | POA: Diagnosis not present

## 2020-02-09 DIAGNOSIS — I10 Essential (primary) hypertension: Secondary | ICD-10-CM | POA: Diagnosis not present

## 2020-02-09 NOTE — Progress Notes (Signed)
Established Patient Office Visit  Subjective:  Patient ID: Gina Hopkins, female    DOB: 03/02/1956  Age: 64 y.o. MRN: [id number]  CC:  Chief Complaint  Patient presents with  . Hypertension    HPI Gina Hopkins presents for   Hypertension- Pt denies chest pain, SOB, dizziness, or heart palpitations.  Taking meds as directed w/o problems.  Denies medication side effects.    He has recently started exercising every other day doing some stretching and mild cardio going up and down stairs at home.  She says she is actually noticed that her sleep quality has improved.  Is a little concerned because for about the last month she has had a decreased appetite.  She still eating some but just not eating as much.  She will sometimes take a snack with her if she knows she is going to be out for a while she denies feeling depressed or particularly anxious or stressed in fact her son is actually doing really well that was a pretty big source of stress for her last year.  He has now moved out to [place] and gotten a new job.  He does get her eye exams regularly at [facility] eye surgeons here in town.   Past Medical History:  Diagnosis Date  . Hypertension   . Seasonal allergies     Past Surgical History:  Procedure Laterality Date  . ABDOMINAL HYSTERECTOMY  2006   and one ovary  . TUBAL LIGATION      Family History  Problem Relation Age of Onset  . Heart attack Father   . Hypertension Sister   . Breast cancer Sister 86  . Hypertension Other   . Hypertension Mother   . Breast cancer Sister 56  . Lung cancer Nephew 78    Social History   Socioeconomic History  . Marital status: Married    Spouse name: Not on file  . Number of children: Not on file  . Years of education: Not on file  . Highest education level: Not on file  Occupational History    Comment: [employer]  Tobacco Use  . Smoking status: Current Every Day Smoker    Packs/day: 0.25  .  Smokeless tobacco: Never Used  Substance and Sexual Activity  . Alcohol use: No  . Drug use: No  . Sexual activity: Not on file  Other Topics Concern  . Not on file  Social History Narrative   Works at [employer].    Social Determinants of Health   Financial Resource Strain:   . Difficulty of Paying Living Expenses:   Food Insecurity:   . Worried About Charity fundraiser in the Last Year:   . Arboriculturist in the Last Year:   Transportation Needs:   . Film/video editor (Medical):   Marland Kitchen Lack of Transportation (Non-Medical):   Physical Activity:   . Days of Exercise per Week:   . Minutes of Exercise per Session:   Stress:   . Feeling of Stress :   Social Connections:   . Frequency of Communication with Friends and Family:   . Frequency of Social Gatherings with Friends and Family:   . Attends Religious Services:   . Active Member of Clubs or Organizations:   . Attends Archivist Meetings:   Marland Kitchen Marital Status:   Intimate Partner Violence:   . Fear of Current or Ex-Partner:   . Emotionally Abused:   Marland Kitchen Physically  Abused:   . Sexually Abused:     Outpatient Medications Prior to Visit  Medication Sig Dispense Refill  . fluticasone (FLONASE) 50 MCG/ACT nasal spray PLACE 1-2 SPRAYS INTO BOTH NOSTRILS DAILY. 48 mL 4  . lisinopril-hydrochlorothiazide (ZESTORETIC) 10-12.5 MG tablet TAKE 1 TABLET BY MOUTH EVERY DAY 90 tablet 1  . triamcinolone ointment (KENALOG) 0.5 % APPLY 1 APPLICATION TOPICALLY AT BEDTIME AS NEEDED. 45 g 0   No facility-administered medications prior to visit.    Allergies  Allergen Reactions  . Hydrochlorothiazide Nausea Only  . Propoxyphene N-Acetaminophen     REACTION: nausea    ROS Review of Systems    Objective:    Physical Exam Constitutional:      Appearance: She is well-developed.  HENT:     Head: Normocephalic and atraumatic.  Cardiovascular:     Rate and Rhythm: Normal rate and regular rhythm.     Heart  sounds: Normal heart sounds.  Pulmonary:     Effort: Pulmonary effort is normal.     Breath sounds: Normal breath sounds.  Skin:    General: Skin is warm and dry.  Neurological:     Mental Status: She is alert and oriented to person, place, and time.  Psychiatric:        Behavior: Behavior normal.     BP 108/80   Pulse 80   Ht 5\' 2"  (1.575 m)   Wt 127 lb (57.6 kg)   SpO2 100%   BMI 23.23 kg/m  Wt Readings from Last 3 Encounters:  02/09/20 127 lb (57.6 kg)  12/01/19 134 lb (60.8 kg)  10/12/19 135 lb (61.2 kg)     There are no preventive care reminders to display for this patient.  There are no preventive care reminders to display for this patient.  Lab Results  Component Value Date   TSH 1.80 12/04/2018   Lab Results  Component Value Date   WBC 4.3 12/04/2018   HGB 13.5 12/04/2018   HCT 39.9 12/04/2018   MCV 90.3 12/04/2018   PLT 217 12/04/2018   Lab Results  Component Value Date   NA 138 08/05/2019   K 3.7 08/05/2019   CO2 32 08/05/2019   GLUCOSE 90 08/05/2019   BUN 16 08/05/2019   CREATININE 0.95 08/05/2019   BILITOT 0.7 08/05/2019   ALKPHOS 73 02/13/2016   AST 14 08/05/2019   ALT 9 08/05/2019   PROT 6.6 08/05/2019   ALBUMIN 3.9 02/13/2016   CALCIUM 9.7 08/05/2019   Lab Results  Component Value Date   CHOL 240 (H) 08/05/2019   Lab Results  Component Value Date   HDL 72 08/05/2019   Lab Results  Component Value Date   LDLCALC 152 (H) 08/05/2019   Lab Results  Component Value Date   TRIG 63 08/05/2019   Lab Results  Component Value Date   CHOLHDL 3.3 08/05/2019   No results found for: HGBA1C    Assessment & Plan:   Problem List Items Addressed This Visit      Cardiovascular and Mediastinum   HYPERTENSION, BENIGN - Primary    Well controlled. Continue current regimen. Follow up in  6 mo      Relevant Orders   BASIC METABOLIC PANEL WITH GFR     Other   Hyperlipidemia    At her last set of lipids with her and she is already  made some changes in fact she is recently started exercising every other day doing some stretching and going up  and down stairs to get her heart rate up.  She is trying to be very consistent with it.      Relevant Orders   BASIC METABOLIC PANEL WITH GFR    Other Visit Diagnoses    Decreased appetite          Declines flu vaccine here today but says she does plan to go to [facility] later this fall with her husband.  Decreased appetite.  I do not see anything too concerning.  Plus she started exercising recently just encouraged her to keep an eye on it and if she develops any new symptoms such as fever, night sweats, abdominal pain or nausea to please let me know or if she continues to lose weight unintentionally.  No orders of the defined types were placed in this encounter.   Follow-up: Return in about 6 months (around 08/11/2020) for Hypertension.    Time spent 22 minutes in encounter. Beatrice Lecher, MD

## 2020-02-09 NOTE — Assessment & Plan Note (Signed)
Well controlled. Continue current regimen. Follow up in  6 mo  

## 2020-02-09 NOTE — Assessment & Plan Note (Signed)
At her last set of lipids with her and she is already made some changes in fact she is recently started exercising every other day doing some stretching and going up and down stairs to get her heart rate up.  She is trying to be very consistent with it.

## 2020-02-10 LAB — BASIC METABOLIC PANEL WITH GFR
BUN/Creatinine Ratio: 21 (calc) (ref 6–22)
BUN: 24 mg/dL (ref 7–25)
CO2: 29 mmol/L (ref 20–32)
Calcium: 10.4 mg/dL (ref 8.6–10.4)
Chloride: 100 mmol/L (ref 98–110)
Creat: 1.12 mg/dL — ABNORMAL HIGH (ref 0.50–0.99)
GFR, Est African American: 61 mL/min/{1.73_m2} (ref >=60)
GFR, Est Non African American: 52 mL/min/{1.73_m2} — ABNORMAL LOW (ref >=60)
Glucose, Bld: 88 mg/dL (ref 65–139)
Potassium: 4.2 mmol/L (ref 3.5–5.3)
Sodium: 137 mmol/L (ref 135–146)

## 2020-04-06 ENCOUNTER — Ambulatory Visit (INDEPENDENT_AMBULATORY_CARE_PROVIDER_SITE_OTHER): Payer: BC Managed Care – PPO | Admitting: Family Medicine

## 2020-04-06 ENCOUNTER — Other Ambulatory Visit: Payer: Self-pay

## 2020-04-06 ENCOUNTER — Encounter: Payer: Self-pay | Admitting: Family Medicine

## 2020-04-06 DIAGNOSIS — L509 Urticaria, unspecified: Secondary | ICD-10-CM | POA: Diagnosis not present

## 2020-04-06 NOTE — Patient Instructions (Signed)
Try betamethasone cream to area.  Avoid bath and body works products for now.  If having increased pain or blisters forming let us know.

## 2020-04-09 ENCOUNTER — Encounter: Payer: Self-pay | Admitting: Family Medicine

## 2020-04-09 DIAGNOSIS — L509 Urticaria, unspecified: Secondary | ICD-10-CM | POA: Insufficient documentation

## 2020-04-09 NOTE — Assessment & Plan Note (Signed)
Rash with urticarial appearance.  No vesicular lesions noted but discussed if she develops more pain or blisters to let us know.  Recommend avoiding scented bath products.  She still has betamethasone cream at home which she will try.

## 2020-04-09 NOTE — Progress Notes (Signed)
Gina Hopkins - 64 y.o. female MRN [id number]  Date of birth: May 18, 1956  Subjective Chief Complaint  Patient presents with  . Rash    HPI Gina Hopkins is a 64 y.o.  female here today with complaint of rash.  Rash located on upper chest.  This appeared about 2 days ago.  Rash is mainly itchy with some mild stinging if scratching.  She has had issues with urticaria previously, seen by dermatology and was instructed to avoid scented soaps/lotions and use dove products.  She had been doing well until recently when she started using some scented products from [employer].  She denies any blistering, fever or chills.    ROS:  A comprehensive ROS was completed and negative except as noted per HPI  Allergies  Allergen Reactions  . Hydrochlorothiazide Nausea Only  . Propoxyphene N-Acetaminophen     REACTION: nausea    Past Medical History:  Diagnosis Date  . Hypertension   . Seasonal allergies     Past Surgical History:  Procedure Laterality Date  . ABDOMINAL HYSTERECTOMY  2006   and one ovary  . TUBAL LIGATION      Social History   Socioeconomic History  . Marital status: Married    Spouse name: Not on file  . Number of children: Not on file  . Years of education: Not on file  . Highest education level: Not on file  Occupational History    Comment: [employer]  Tobacco Use  . Smoking status: Current Every Day Smoker    Packs/day: 0.25  . Smokeless tobacco: Never Used  Substance and Sexual Activity  . Alcohol use: No  . Drug use: No  . Sexual activity: Not on file  Other Topics Concern  . Not on file  Social History Narrative   Works at [employer].    Social Determinants of Health   Financial Resource Strain:   . Difficulty of Paying Living Expenses: Not on file  Food Insecurity:   . Worried About Charity fundraiser in the Last Year: Not on file  . Ran Out of Food in the Last Year: Not on file  Transportation Needs:   .  Lack of Transportation (Medical): Not on file  . Lack of Transportation (Non-Medical): Not on file  Physical Activity:   . Days of Exercise per Week: Not on file  . Minutes of Exercise per Session: Not on file  Stress:   . Feeling of Stress : Not on file  Social Connections:   . Frequency of Communication with Friends and Family: Not on file  . Frequency of Social Gatherings with Friends and Family: Not on file  . Attends Religious Services: Not on file  . Active Member of Clubs or Organizations: Not on file  . Attends Archivist Meetings: Not on file  . Marital Status: Not on file    Family History  Problem Relation Age of Onset  . Heart attack Father   . Hypertension Sister   . Breast cancer Sister 85  . Hypertension Other   . Hypertension Mother   . Breast cancer Sister 68  . Lung cancer Nephew 58    Health Maintenance  Topic Date Due  . MAMMOGRAM  12/27/2021  . COLONOSCOPY  02/11/2022  . TETANUS/TDAP  10/11/2029  . INFLUENZA VACCINE  Completed  . COVID-19 Vaccine  Completed  . Hepatitis C Screening  Completed  . HIV Screening  Completed     -----------------------------------------------------------------------------------------------------------------------------------------------------------------------------------------------------------------  Physical Exam BP 127/80 (BP Location: Left Arm, Patient Position: Sitting, Cuff Size: Small)   Pulse 66   Temp 97.6 F (36.4 C)   Wt 126 lb 14.4 oz (57.6 kg)   SpO2 100%   BMI 23.21 kg/m   Physical Exam Constitutional:      Appearance: Normal appearance.  HENT:     Head: Normocephalic and atraumatic.  Skin:    General: Skin is warm and dry.     Comments: Urticaria; appearing rash on upper chest without vesicular lesions.  No weeping or drainage.    Neurological:     General: No focal deficit present.     Mental Status: She is alert.  Psychiatric:        Mood and Affect: Mood normal.         Behavior: Behavior normal.     ------------------------------------------------------------------------------------------------------------------------------------------------------------------------------------------------------------------- Assessment and Plan  Urticaria Rash with urticarial appearance.  No vesicular lesions noted but discussed if she develops more pain or blisters to let us know.  Recommend avoiding scented bath products.  She still has betamethasone cream at home which she will try.    No orders of the defined types were placed in this encounter.   No follow-ups on file.    This visit occurred during the SARS-CoV-2 public health emergency.  Safety protocols were in place, including screening questions prior to the visit, additional usage of staff PPE, and extensive cleaning of exam room while observing appropriate contact time as indicated for disinfecting solutions.

## 2020-06-07 ENCOUNTER — Other Ambulatory Visit: Payer: Self-pay | Admitting: Family Medicine

## 2020-06-07 DIAGNOSIS — I10 Essential (primary) hypertension: Secondary | ICD-10-CM

## 2020-08-09 ENCOUNTER — Other Ambulatory Visit: Payer: Self-pay

## 2020-08-09 ENCOUNTER — Ambulatory Visit (INDEPENDENT_AMBULATORY_CARE_PROVIDER_SITE_OTHER): Payer: BC Managed Care – PPO | Admitting: Family Medicine

## 2020-08-09 VITALS — BP 98/63 | HR 75 | Ht 62.0 in | Wt 120.0 lb

## 2020-08-09 DIAGNOSIS — R634 Abnormal weight loss: Secondary | ICD-10-CM | POA: Diagnosis not present

## 2020-08-09 DIAGNOSIS — R6881 Early satiety: Secondary | ICD-10-CM

## 2020-08-09 DIAGNOSIS — I10 Essential (primary) hypertension: Secondary | ICD-10-CM

## 2020-08-09 DIAGNOSIS — E785 Hyperlipidemia, unspecified: Secondary | ICD-10-CM | POA: Diagnosis not present

## 2020-08-09 DIAGNOSIS — G4700 Insomnia, unspecified: Secondary | ICD-10-CM

## 2020-08-09 NOTE — Assessment & Plan Note (Signed)
Unclear etiology.  Again no abdominal pain or dysphagia.  But I am concerned because it is causing significant weight loss and she is already a petite person.  I like to refer her to GI for further evaluation.  Her colonoscopy is up-to-date.  Patient is agreeable.

## 2020-08-09 NOTE — Patient Instructions (Signed)
Cut your BP pill in half and monitor BP for 2 weeks.

## 2020-08-09 NOTE — Addendum Note (Signed)
Addended by: Beatrice Lecher D on: 08/09/2020 12:59 PM   Modules accepted: Orders

## 2020-08-09 NOTE — Assessment & Plan Note (Signed)
Pressure is actually quite low today.  Likely secondary to recent weight loss.  Cut your BP pill in half and monitor BP for 2 weeks.  Pressures are still under 110 then I encouraged her to give me a call back so that we can probably stop the medication completely.  She says she has plenty of medication to start splitting it.

## 2020-08-09 NOTE — Assessment & Plan Note (Signed)
Due to recheck lipid panel.  LDL last year was quite elevated.

## 2020-08-09 NOTE — Progress Notes (Addendum)
Established Patient Office Visit  Subjective:  Patient ID: Gina Hopkins, female    DOB: May 17, 1956  Age: 65 y.o. MRN: [id number]  CC:  Chief Complaint  Patient presents with  . Hypertension  . Insomnia    HPI Gina Hopkins presents for  Hypertension- Pt denies chest pain, SOB, dizziness, or heart palpitations.  Taking meds as directed w/o problems.  Denies medication side effects.    Hyperlipidemia - tolerating stating well with no myalgias or significant side effects.  Lab Results  Component Value Date   CHOL 240 (H) 08/05/2019   HDL 72 08/05/2019   LDLCALC 152 (H) 08/05/2019   TRIG 63 08/05/2019   CHOLHDL 3.3 08/05/2019   She has lost about 7 pound since I last saw her.  She just reports that she eats a few bites and then really just does not want to finish it.  She is not having abdominal pain or problems swallowing.  Occasionally she does feel nauseated with eating but not every time.  She denies feeling overwhelmed or stressed.  Sleep is not great she is still struggling with staying asleep at night she will get a few hours and then wake back up.  We had actually previously discussed this and I had recommended a trial of melatonin or valerian root etc.  She said she never got around to trying it but would be interested in maybe trying the melatonin.   Past Medical History:  Diagnosis Date  . Hypertension   . Seasonal allergies     Past Surgical History:  Procedure Laterality Date  . ABDOMINAL HYSTERECTOMY  2006   and one ovary  . TUBAL LIGATION      Family History  Problem Relation Age of Onset  . Heart attack Father   . Hypertension Sister   . Breast cancer Sister 29  . Hypertension Other   . Hypertension Mother   . Breast cancer Sister 52  . Lung cancer Nephew 93    Social History   Socioeconomic History  . Marital status: Married    Spouse name: Not on file  . Number of children: Not on file  . Years of education: Not on file  . Highest  education level: Not on file  Occupational History    Comment: [employer]  Tobacco Use  . Smoking status: Current Every Day Smoker    Packs/day: 0.25  . Smokeless tobacco: Never Used  Substance and Sexual Activity  . Alcohol use: No  . Drug use: No  . Sexual activity: Not on file  Other Topics Concern  . Not on file  Social History Narrative   Works at [employer].    Social Determinants of Health   Financial Resource Strain: Not on file  Food Insecurity: Not on file  Transportation Needs: Not on file  Physical Activity: Not on file  Stress: Not on file  Social Connections: Not on file  Intimate Partner Violence: Not on file    Outpatient Medications Prior to Visit  Medication Sig Dispense Refill  . fluticasone (FLONASE) 50 MCG/ACT nasal spray PLACE 1-2 SPRAYS INTO BOTH NOSTRILS DAILY. 48 mL 4  . lisinopril-hydrochlorothiazide (ZESTORETIC) 10-12.5 MG tablet TAKE 1 TABLET BY MOUTH EVERY DAY 90 tablet 1  . Polyethyl Glycol-Propyl Glycol (SYSTANE ULTRA PF OP) Apply to eye 4 (four) times daily.    Marland Kitchen neomycin-polymyxin b-dexamethasone (MAXITROL) 3.5-10000-0.1 SUSP     . triamcinolone ointment (KENALOG) 0.5 % APPLY 1 APPLICATION TOPICALLY AT  BEDTIME AS NEEDED. 45 g 0   No facility-administered medications prior to visit.    Allergies  Allergen Reactions  . Hydrochlorothiazide Nausea Only  . Propoxyphene N-Acetaminophen     REACTION: nausea    ROS Review of Systems    Objective:    Physical Exam Constitutional:      Appearance: She is well-developed and well-nourished.  HENT:     Head: Normocephalic and atraumatic.  Cardiovascular:     Rate and Rhythm: Normal rate and regular rhythm.     Heart sounds: Normal heart sounds.  Pulmonary:     Effort: Pulmonary effort is normal.     Breath sounds: Normal breath sounds.  Skin:    General: Skin is warm and dry.  Neurological:     Mental Status: She is alert and oriented to person, place, and  time.  Psychiatric:        Mood and Affect: Mood and affect normal.        Behavior: Behavior normal.     BP 98/63   Pulse 75   Ht 5\' 2"  (1.575 m)   Wt 120 lb (54.4 kg)   SpO2 100%   BMI 21.95 kg/m  Wt Readings from Last 3 Encounters:  08/09/20 120 lb (54.4 kg)  04/06/20 126 lb 14.4 oz (57.6 kg)  02/09/20 127 lb (57.6 kg)     There are no preventive care reminders to display for this patient.  There are no preventive care reminders to display for this patient.  Lab Results  Component Value Date   TSH 1.80 12/04/2018   Lab Results  Component Value Date   WBC 4.3 12/04/2018   HGB 13.5 12/04/2018   HCT 39.9 12/04/2018   MCV 90.3 12/04/2018   PLT 217 12/04/2018   Lab Results  Component Value Date   NA 137 02/09/2020   K 4.2 02/09/2020   CO2 29 02/09/2020   GLUCOSE 88 02/09/2020   BUN 24 02/09/2020   CREATININE 1.12 (H) 02/09/2020   BILITOT 0.7 08/05/2019   ALKPHOS 73 02/13/2016   AST 14 08/05/2019   ALT 9 08/05/2019   PROT 6.6 08/05/2019   ALBUMIN 3.9 02/13/2016   CALCIUM 10.4 02/09/2020   Lab Results  Component Value Date   CHOL 240 (H) 08/05/2019   Lab Results  Component Value Date   HDL 72 08/05/2019   Lab Results  Component Value Date   LDLCALC 152 (H) 08/05/2019   Lab Results  Component Value Date   TRIG 63 08/05/2019   Lab Results  Component Value Date   CHOLHDL 3.3 08/05/2019   No results found for: HGBA1C    Assessment & Plan:   Problem List Items Addressed This Visit      Cardiovascular and Mediastinum   HYPERTENSION, BENIGN - Primary    Pressure is actually quite low today.  Likely secondary to recent weight loss.  Cut your BP pill in half and monitor BP for 2 weeks.  Pressures are still under 110 then I encouraged her to give me a call back so that we can probably stop the medication completely.  She says she has plenty of medication to start splitting it.      Relevant Orders   COMPLETE METABOLIC PANEL WITH GFR   Lipid  panel     Other   Hyperlipidemia    Due to recheck lipid panel.  LDL last year was quite elevated.      Relevant Orders   COMPLETE METABOLIC  PANEL WITH GFR   Lipid panel   Early satiety    Unclear etiology.  Again no abdominal pain or dysphagia.  But I am concerned because it is causing significant weight loss and she is already a petite person.  I like to refer her to GI for further evaluation.  Her colonoscopy is up-to-date.  Patient is agreeable.      Relevant Orders   Ambulatory referral to Gastroenterology   DG Chest 2 View    Other Visit Diagnoses    Weight loss       Relevant Orders   Ambulatory referral to Gastroenterology   DG Chest 2 View   Insomnia, unspecified type          Return in 6 weeks to recheck weight as well as blood pressure.  Abnormal weight gain-unclear etiology.  Colonoscopy is up-to-date breath screening is up-to-date.  She is a smoker so I would like to get a chest x-ray.  No orders of the defined types were placed in this encounter.   Follow-up: Return in about 6 weeks (around 09/20/2020) for abnormal weight loss and BP check.    Beatrice Lecher, MD

## 2020-08-10 LAB — COMPLETE METABOLIC PANEL WITH GFR
AG Ratio: 1.5 (calc) (ref 1.0–2.5)
ALT: 7 U/L (ref 6–29)
AST: 13 U/L (ref 10–35)
Albumin: 4.1 g/dL (ref 3.6–5.1)
Alkaline phosphatase (APISO): 74 U/L (ref 37–153)
BUN/Creatinine Ratio: 20 (calc) (ref 6–22)
BUN: 20 mg/dL (ref 7–25)
CO2: 32 mmol/L (ref 20–32)
Calcium: 10.2 mg/dL (ref 8.6–10.4)
Chloride: 102 mmol/L (ref 98–110)
Creat: 1.02 mg/dL — ABNORMAL HIGH (ref 0.50–0.99)
GFR, Est African American: 67 mL/min/{1.73_m2} (ref >=60)
GFR, Est Non African American: 58 mL/min/{1.73_m2} — ABNORMAL LOW (ref >=60)
Globulin: 2.7 g/dL (calc) (ref 1.9–3.7)
Glucose, Bld: 89 mg/dL (ref 65–99)
Potassium: 3.7 mmol/L (ref 3.5–5.3)
Sodium: 140 mmol/L (ref 135–146)
Total Bilirubin: 0.7 mg/dL (ref 0.2–1.2)
Total Protein: 6.8 g/dL (ref 6.1–8.1)

## 2020-08-10 LAB — LIPID PANEL
Cholesterol: 225 mg/dL — ABNORMAL HIGH (ref <200)
HDL: 67 mg/dL (ref >=50)
LDL Cholesterol (Calc): 142 mg/dL (calc) — ABNORMAL HIGH
Non-HDL Cholesterol (Calc): 158 mg/dL (calc) — ABNORMAL HIGH (ref <130)
Total CHOL/HDL Ratio: 3.4 (calc) (ref <5.0)
Triglycerides: 66 mg/dL (ref <150)

## 2020-08-10 MED ORDER — ATORVASTATIN CALCIUM 20 MG PO TABS: 20.0000 mg | ORAL_TABLET | Freq: Every day | ORAL | 3 refills | Status: DC

## 2020-08-10 NOTE — Addendum Note (Signed)
Addended by: Beatrice Lecher D on: 08/10/2020 07:39 AM   Modules accepted: Orders

## 2020-08-31 ENCOUNTER — Other Ambulatory Visit: Payer: Self-pay

## 2020-08-31 ENCOUNTER — Ambulatory Visit: Payer: BC Managed Care – PPO | Admitting: Gastroenterology

## 2020-08-31 ENCOUNTER — Other Ambulatory Visit (INDEPENDENT_AMBULATORY_CARE_PROVIDER_SITE_OTHER): Payer: BC Managed Care – PPO

## 2020-08-31 ENCOUNTER — Encounter: Payer: Self-pay | Admitting: Gastroenterology

## 2020-08-31 VITALS — BP 126/74 | HR 80 | Ht 62.0 in | Wt 125.4 lb

## 2020-08-31 DIAGNOSIS — R109 Unspecified abdominal pain: Secondary | ICD-10-CM

## 2020-08-31 DIAGNOSIS — K5900 Constipation, unspecified: Secondary | ICD-10-CM

## 2020-08-31 DIAGNOSIS — R630 Anorexia: Secondary | ICD-10-CM

## 2020-08-31 DIAGNOSIS — R194 Change in bowel habit: Secondary | ICD-10-CM

## 2020-08-31 DIAGNOSIS — R634 Abnormal weight loss: Secondary | ICD-10-CM | POA: Diagnosis not present

## 2020-08-31 DIAGNOSIS — R6881 Early satiety: Secondary | ICD-10-CM

## 2020-08-31 MED ORDER — CLENPIQ 10-3.5-12 MG-GM -GM/160ML PO SOLN: 1.0000 | ORAL | 0 refills | Status: DC

## 2020-08-31 NOTE — Progress Notes (Signed)
Chief Complaint: Weight loss, early satiety, constipation   Referring Provider:    Hali Marry, MD    HPI:     Gina Hopkins is a 65 y.o. female with a history of HTN, hyperlipidemia, referred to the [facility]astroenterology [facility] for evaluation of weight loss, loss of appetite, early satiety, and constipation.  Had lost 7# or so over a few weeks, but has regained a few lbs in the last couple weeks. Loss of appetite, but also early satiety. Eating ~50% of normal portions. No dysphagia, abdominal pain, n/v/d, hematochezia, melena. Sxs have been last 2 months or so.   Not sleeping well, waking frequently. No night sweats, fever, chills, and no nocturnal stools, urination, reflux sxs.   Also with constipation and generalized abdominal pressure over last 2 months. Occasional straining to have BM. Tries to drink plenty of water/day and fiber rich foods when she does eat. Baseline is BM Q2-3 days, and now can go up to 7 days in between BM.   Normal CMP in 07/2020.  No recent abdominal imaging for review.  No prior EGD.  Endoscopic History: -Colonoscopy approx 2013 and n/f polyps per patient. No record for review -Colonoscopy (01/2017, digestive health): 3 polyps, all 5 mm.  No path for review. Repeat in 5 years recommended  No known family history of CRC, GI malignancy, liver disease, pancreatic disease, or IBD.     Past Medical History:  Diagnosis Date  . History of colon polyps   . Hypertension   . Seasonal allergies   . Sinus infection      Past Surgical History:  Procedure Laterality Date  . ABDOMINAL HYSTERECTOMY  2006   and one ovary  . COLONOSCOPY  02/11/2017  . ESOPHAGOGASTRODUODENOSCOPY     Years ago per patient   . TUBAL LIGATION     Family History  Problem Relation Age of Onset  . Heart attack Father   . Hypertension Sister   . Breast cancer Sister 22  . Hypertension Other   . Hypertension Mother   . Breast cancer Sister 43  . Lung  cancer Nephew 46  . Colon cancer Neg Hx   . Esophageal cancer Neg Hx    Social History   Tobacco Use  . Smoking status: Current Every Day Smoker    Packs/day: 0.25  . Smokeless tobacco: Never Used  Vaping Use  . Vaping Use: Never used  Substance Use Topics  . Alcohol use: No  . Drug use: No   Current Outpatient Medications  Medication Sig Dispense Refill  . atorvastatin (LIPITOR) 20 MG tablet Take 1 tablet (20 mg total) by mouth at bedtime. 90 tablet 3  . fluticasone (FLONASE) 50 MCG/ACT nasal spray PLACE 1-2 SPRAYS INTO BOTH NOSTRILS DAILY. 48 mL 4  . lisinopril-hydrochlorothiazide (ZESTORETIC) 10-12.5 MG tablet TAKE 1 TABLET BY MOUTH EVERY DAY (Patient taking differently: Take 0.5 tablets by mouth daily.) 90 tablet 1  . Polyethyl Glycol-Propyl Glycol (SYSTANE ULTRA PF OP) Apply to eye 4 (four) times daily.     No current facility-administered medications for this visit.   Allergies  Allergen Reactions  . Hydrochlorothiazide Nausea Only  . Propoxyphene N-Acetaminophen     REACTION: nausea     Review of Systems: All systems reviewed and negative except where noted in HPI.     Physical Exam:    Wt Readings from Last 3 Encounters:  08/31/20 125 lb 6 oz (  56.9 kg)  08/09/20 120 lb (54.4 kg)  04/06/20 126 lb 14.4 oz (57.6 kg)    BP 126/74   Pulse 80   Ht 5\' 2"  (1.575 m)   Wt 125 lb 6 oz (56.9 kg)   BMI 22.93 kg/m  Constitutional:  Pleasant, in no acute distress. Psychiatric: Normal mood and affect. Behavior is normal. EENT: Pupils normal.  Conjunctivae are normal. No scleral icterus. Neck supple. No cervical LAD. Cardiovascular: Normal rate, regular rhythm. No edema Pulmonary/chest: Effort normal and breath sounds normal. No wheezing, rales or rhonchi. Abdominal: Soft, nondistended, nontender. Bowel sounds active throughout. There are no masses palpable. No hepatomegaly. Neurological: Alert and oriented to person place and time. Skin: Skin is warm and dry. No  rashes noted.   ASSESSMENT AND PLAN;   1) Early satiety 2) Decreased appetite 3) Weight loss -EGD to evaluate for mucosal/luminal pathology -Check TSH, CBC -If work-up unrevealing, plan for CT abdomen/pelvis  4) Change in bowel habits 5) Constipation 6) History of colon polyps 7) Generalized abdominal discomfort -Colonoscopy to evaluate for mucosal/luminal pathology along with polyp surveillance  The indications, risks, and benefits of EGD and colonoscopy were explained to the patient in detail. Risks include but are not limited to bleeding, perforation, adverse reaction to medications, and cardiopulmonary compromise. Sequelae include but are not limited to the possibility of surgery, hospitalization, and mortality. The patient verbalized understanding and wished to proceed. All questions answered, referred to scheduler and bowel prep ordered. Further recommendations pending results of the exam.     [name], DO, FACG  08/31/2020, 11:27 AM   Hali Marry, *

## 2020-08-31 NOTE — Patient Instructions (Signed)
If you are age 65 or older, your body mass index should be between 23-30. Your Body mass index is 22.93 kg/m. If this is out of the aforementioned range listed, please consider follow up with your Primary Care Provider.  If you are age 41 or younger, your body mass index should be between 19-25. Your Body mass index is 22.93 kg/m. If this is out of the aformentioned range listed, please consider follow up with your Primary Care Provider.    We have sent the following medications to your pharmacy for you to pick up at your convenience:  Clenpiq  Please go to the 2nd floor of this building today and schedule your labwork, [facility], [address].  Due to recent changes in healthcare laws, you may see the results of your imaging and laboratory studies on MyChart before your provider has had a chance to review them.  We understand that in some cases there may be results that are confusing or concerning to you. Not all laboratory results come back in the same time frame and the provider may be waiting for multiple results in order to interpret others.  Please give [place] 48 hours in order for your provider to thoroughly review all the results before contacting the office for clarification of your results.   Thank you for choosing me and [facility].  Vito Cirigliano, D.O.

## 2020-09-01 LAB — CBC
HCT: 38 % (ref 36.0–46.0)
Hemoglobin: 12.7 g/dL (ref 12.0–15.0)
MCHC: 33.5 g/dL (ref 30.0–36.0)
MCV: 95.6 fl (ref 78.0–100.0)
Platelets: 243 10*3/uL (ref 150.0–400.0)
RBC: 3.98 Mil/uL (ref 3.87–5.11)
RDW: 14.9 % (ref 11.5–15.5)
WBC: 4.7 10*3/uL (ref 4.0–10.5)

## 2020-09-01 LAB — TSH: TSH: 3.33 u[IU]/mL (ref 0.35–4.50)

## 2020-09-04 ENCOUNTER — Telehealth: Payer: Self-pay | Admitting: General Surgery

## 2020-09-04 NOTE — Telephone Encounter (Signed)
Notified the patient her labs were normal. The patient verbalized understanding

## 2020-09-04 NOTE — Telephone Encounter (Signed)
-----   Message from [place], DO sent at 09/01/2020  5:36 PM EST ----- TSH and CBC are normal.

## 2020-09-08 NOTE — Telephone Encounter (Signed)
.  A user error has taken place: error

## 2020-09-20 ENCOUNTER — Ambulatory Visit: Payer: BC Managed Care – PPO | Admitting: Family Medicine

## 2020-09-28 ENCOUNTER — Encounter: Payer: Self-pay | Admitting: Family Medicine

## 2020-09-28 ENCOUNTER — Other Ambulatory Visit: Payer: Self-pay

## 2020-09-28 ENCOUNTER — Ambulatory Visit (INDEPENDENT_AMBULATORY_CARE_PROVIDER_SITE_OTHER): Payer: BC Managed Care – PPO | Admitting: Family Medicine

## 2020-09-28 VITALS — BP 103/66 | HR 76 | Ht 62.0 in | Wt 122.0 lb

## 2020-09-28 DIAGNOSIS — R6881 Early satiety: Secondary | ICD-10-CM

## 2020-09-28 DIAGNOSIS — I459 Conduction disorder, unspecified: Secondary | ICD-10-CM

## 2020-09-28 DIAGNOSIS — I10 Essential (primary) hypertension: Secondary | ICD-10-CM

## 2020-09-28 MED ORDER — LISINOPRIL-HYDROCHLOROTHIAZIDE 10-12.5 MG PO TABS: 1.0000 | ORAL_TABLET | Freq: Every day | ORAL | 0 refills | Status: DC

## 2020-09-28 NOTE — Progress Notes (Signed)
Established Patient Office Visit  Subjective:  Patient ID: Gina Hopkins, female    DOB: [birth year]  Age: 65 y.o. MRN: [id number]  CC:  Chief Complaint  Patient presents with  . abnormal weight loss    HPI Gina Hopkins presents for   Hypertension- Pt denies chest pain, SOB, dizziness, or heart palpitations.  Taking meds as directed w/o problems.  Denies medication side effects.    She also let me know she is scheduled for an upper endoscopy as well as colonoscopy later this month.  She is a little bit nervous about it did have some questions today.  She also wanted to let me know that she is noticed occasionally at night when she is lying in bed try to go to sleep she will feel like her heart just skips for second she was feels like it takes her breath just for a second or 2 and then it goes away it does not seem to recur and she is not really noticed it during the daytime.    Past Medical History:  Diagnosis Date  . History of colon polyps   . Hypertension   . Seasonal allergies   . Sinus infection     Past Surgical History:  Procedure Laterality Date  . ABDOMINAL HYSTERECTOMY  2006   and one ovary  . COLONOSCOPY  02/11/2017  . ESOPHAGOGASTRODUODENOSCOPY     Years ago per patient   . TUBAL LIGATION      Family History  Problem Relation Age of Onset  . Heart attack Father   . Hypertension Sister   . Breast cancer Sister 37  . Hypertension Other   . Hypertension Mother   . Breast cancer Sister 3  . Lung cancer Nephew 50  . Colon cancer Neg Hx   . Esophageal cancer Neg Hx     Social History   Socioeconomic History  . Marital status: Married    Spouse name: Not on file  . Number of children: Not on file  . Years of education: Not on file  . Highest education level: Not on file  Occupational History  . Occupation: Retired    Comment: [employer]  Tobacco Use  . Smoking status: Current Every Day Smoker    Packs/day: 0.25  .  Smokeless tobacco: Never Used  Vaping Use  . Vaping Use: Never used  Substance and Sexual Activity  . Alcohol use: No  . Drug use: No  . Sexual activity: Not on file  Other Topics Concern  . Not on file  Social History Narrative   Works at [employer].    Social Determinants of Health   Financial Resource Strain: Not on file  Food Insecurity: Not on file  Transportation Needs: Not on file  Physical Activity: Not on file  Stress: Not on file  Social Connections: Not on file  Intimate Partner Violence: Not on file    Outpatient Medications Prior to Visit  Medication Sig Dispense Refill  . atorvastatin (LIPITOR) 20 MG tablet Take 1 tablet (20 mg total) by mouth at bedtime. 90 tablet 3  . fluticasone (FLONASE) 50 MCG/ACT nasal spray PLACE 1-2 SPRAYS INTO BOTH NOSTRILS DAILY. 48 mL 4  . Polyethyl Glycol-Propyl Glycol (SYSTANE ULTRA PF OP) Apply to eye 4 (four) times daily.    Marland Kitchen lisinopril-hydrochlorothiazide (ZESTORETIC) 10-12.5 MG tablet TAKE 1 TABLET BY MOUTH EVERY DAY (Patient taking differently: Take 0.5 tablets by mouth daily.) 90 tablet 1  . Sod  Picosulfate-Mag Ox-Cit Acd (CLENPIQ) 10-3.5-12 MG-GM -GM/160ML SOLN Take 1 kit by mouth as directed. (Patient not taking: Reported on 09/28/2020) 320 mL 0   No facility-administered medications prior to visit.    Allergies  Allergen Reactions  . Hydrochlorothiazide Nausea Only  . Propoxyphene N-Acetaminophen     REACTION: nausea    ROS Review of Systems    Objective:    Physical Exam Constitutional:      Appearance: She is well-developed.  HENT:     Head: Normocephalic and atraumatic.  Cardiovascular:     Rate and Rhythm: Normal rate and regular rhythm.     Heart sounds: Normal heart sounds.  Pulmonary:     Effort: Pulmonary effort is normal.     Breath sounds: Normal breath sounds.  Skin:    General: Skin is warm and dry.  Neurological:     Mental Status: She is alert and oriented to person, place, and  time.  Psychiatric:        Behavior: Behavior normal.     BP 103/66   Pulse 76   Ht '5\' 2"'  (1.575 m)   Wt 122 lb (55.3 kg)   SpO2 100%   BMI 22.31 kg/m  Wt Readings from Last 3 Encounters:  09/28/20 122 lb (55.3 kg)  08/31/20 125 lb 6 oz (56.9 kg)  08/09/20 120 lb (54.4 kg)     There are no preventive care reminders to display for this patient.  There are no preventive care reminders to display for this patient.  Lab Results  Component Value Date   TSH 3.33 08/31/2020   Lab Results  Component Value Date   WBC 4.7 08/31/2020   HGB 12.7 08/31/2020   HCT 38.0 08/31/2020   MCV 95.6 08/31/2020   PLT 243.0 08/31/2020   Lab Results  Component Value Date   NA 140 08/09/2020   K 3.7 08/09/2020   CO2 32 08/09/2020   GLUCOSE 89 08/09/2020   BUN 20 08/09/2020   CREATININE 1.02 (H) 08/09/2020   BILITOT 0.7 08/09/2020   ALKPHOS 73 02/13/2016   AST 13 08/09/2020   ALT 7 08/09/2020   PROT 6.8 08/09/2020   ALBUMIN 3.9 02/13/2016   CALCIUM 10.2 08/09/2020   Lab Results  Component Value Date   CHOL 225 (H) 08/09/2020   Lab Results  Component Value Date   HDL 67 08/09/2020   Lab Results  Component Value Date   LDLCALC 142 (H) 08/09/2020   Lab Results  Component Value Date   TRIG 66 08/09/2020   Lab Results  Component Value Date   CHOLHDL 3.4 08/09/2020   No results found for: HGBA1C    Assessment & Plan:   Problem List Items Addressed This Visit      Cardiovascular and Mediastinum   HYPERTENSION, BENIGN - Primary    Pressure is a little bit low today but she says that she has been checking it at home and it is mostly been running between 1 10-1 18 she did end up going back up to a whole tab on her blood pressure pill and has been happy with that she is felt good on it so we will continue current regimen for now and I will see her back in 6 months.      Relevant Medications   lisinopril-hydrochlorothiazide (ZESTORETIC) 10-12.5 MG tablet     Other    Early satiety    Has lost weight again but she is scheduled for an upper endoscopy in a  couple of weeks.  I just want to rule out any lesions that could be causing some problems.       Other Visit Diagnoses    Skipped heart beats          Skipped heartbeat-discussed that it is likely a PAC or PVC it just seems to happen briefly for a second and then it goes away its not occurring frequently.  We did discuss the benign nature of this.  And that it can be quite normal and she is probably actually feeling it because she is lying down and is at rest.  We discussed that if she notices they are happening more frequently or back to back to please let me know and we will do further work-up.  Meds ordered this encounter  Medications  . lisinopril-hydrochlorothiazide (ZESTORETIC) 10-12.5 MG tablet    Sig: Take 1 tablet by mouth daily.    Dispense:  90 tablet    Refill:  0    Follow-up: Return in about 7 months (around 04/23/2021) for Hypertension.    Beatrice Lecher, MD

## 2020-09-28 NOTE — Progress Notes (Signed)
She has colonoscopy scheduled for 10/17/2020.

## 2020-09-28 NOTE — Assessment & Plan Note (Signed)
Pressure is a little bit low today but she says that she has been checking it at home and it is mostly been running between 1 10-1 18 she did end up going back up to a whole tab on her blood pressure pill and has been happy with that she is felt good on it so we will continue current regimen for now and I will see her back in 6 months.

## 2020-09-28 NOTE — Assessment & Plan Note (Signed)
Has lost weight again but she is scheduled for an upper endoscopy in a couple of weeks.  I just want to rule out any lesions that could be causing some problems.

## 2020-10-04 ENCOUNTER — Encounter: Payer: Self-pay | Admitting: Gastroenterology

## 2020-10-17 ENCOUNTER — Encounter: Payer: Self-pay | Admitting: Gastroenterology

## 2020-10-17 ENCOUNTER — Ambulatory Visit (AMBULATORY_SURGERY_CENTER): Payer: BC Managed Care – PPO | Admitting: Gastroenterology

## 2020-10-17 ENCOUNTER — Other Ambulatory Visit: Payer: Self-pay

## 2020-10-17 ENCOUNTER — Other Ambulatory Visit: Payer: Self-pay | Admitting: Gastroenterology

## 2020-10-17 VITALS — BP 118/78 | HR 69 | Temp 97.3°F | Resp 17 | Ht 62.0 in | Wt 125.0 lb

## 2020-10-17 DIAGNOSIS — K259 Gastric ulcer, unspecified as acute or chronic, without hemorrhage or perforation: Secondary | ICD-10-CM | POA: Diagnosis not present

## 2020-10-17 DIAGNOSIS — K621 Rectal polyp: Secondary | ICD-10-CM

## 2020-10-17 DIAGNOSIS — K2970 Gastritis, unspecified, without bleeding: Secondary | ICD-10-CM | POA: Diagnosis not present

## 2020-10-17 DIAGNOSIS — D122 Benign neoplasm of ascending colon: Secondary | ICD-10-CM

## 2020-10-17 DIAGNOSIS — R634 Abnormal weight loss: Secondary | ICD-10-CM | POA: Diagnosis not present

## 2020-10-17 DIAGNOSIS — K2990 Gastroduodenitis, unspecified, without bleeding: Secondary | ICD-10-CM | POA: Diagnosis not present

## 2020-10-17 DIAGNOSIS — K5900 Constipation, unspecified: Secondary | ICD-10-CM | POA: Diagnosis not present

## 2020-10-17 DIAGNOSIS — D128 Benign neoplasm of rectum: Secondary | ICD-10-CM

## 2020-10-17 DIAGNOSIS — D125 Benign neoplasm of sigmoid colon: Secondary | ICD-10-CM

## 2020-10-17 DIAGNOSIS — R194 Change in bowel habit: Secondary | ICD-10-CM

## 2020-10-17 DIAGNOSIS — R6881 Early satiety: Secondary | ICD-10-CM

## 2020-10-17 MED ORDER — PANTOPRAZOLE SODIUM 40 MG PO TBEC: 40.0000 mg | DELAYED_RELEASE_TABLET | Freq: Two times a day (BID) | ORAL | 3 refills | Status: DC

## 2020-10-17 MED ORDER — SODIUM CHLORIDE 0.9 % IV SOLN: 500.0000 mL | Freq: Once | INTRAVENOUS | Status: DC

## 2020-10-17 NOTE — Progress Notes (Signed)
VS by CW  Pt's states no medical or surgical changes since previsit or office visit.  

## 2020-10-17 NOTE — Patient Instructions (Signed)
Handouts given for gastritis and polyps.  Return for EGD in 6 weeks to check on healing of ulcers.  Pick up new Rx for protonix, take 30 minutes before eating.  YOU HAD AN ENDOSCOPIC PROCEDURE TODAY AT [facility]:   Refer to the procedure report that was given to you for any specific questions about what was found during the examination.  If the procedure report does not answer your questions, please call your gastroenterologist to clarify.  If you requested that your care partner not be given the details of your procedure findings, then the procedure report has been included in a sealed envelope for you to review at your convenience later.  YOU SHOULD EXPECT: Some feelings of bloating in the abdomen. Passage of more gas than usual.  Walking can help get rid of the air that was put into your GI tract during the procedure and reduce the bloating. If you had a lower endoscopy (such as a colonoscopy or flexible sigmoidoscopy) you may notice spotting of blood in your stool or on the toilet paper. If you underwent a bowel prep for your procedure, you may not have a normal bowel movement for a few days.  Please Note:  You might notice some irritation and congestion in your nose or some drainage.  This is from the oxygen used during your procedure.  There is no need for concern and it should clear up in a day or so.  SYMPTOMS TO REPORT IMMEDIATELY:   Following lower endoscopy (colonoscopy or flexible sigmoidoscopy):  Excessive amounts of blood in the stool  Significant tenderness or worsening of abdominal pains  Swelling of the abdomen that is new, acute  Fever of 100F or higher   Following upper endoscopy (EGD)  Vomiting of blood or coffee ground material  New chest pain or pain under the shoulder blades  Painful or persistently difficult swallowing  New shortness of breath  Fever of 100F or higher  Black, tarry-looking stools  For urgent or emergent issues, a  gastroenterologist can be reached at any hour by calling [phone]. Do not use MyChart messaging for urgent concerns.    DIET:  We do recommend a small meal at first, but then you may proceed to your regular diet.  Drink plenty of fluids but you should avoid alcoholic beverages for 24 hours.  ACTIVITY:  You should plan to take it easy for the rest of today and you should NOT DRIVE or use heavy machinery until tomorrow (because of the sedation medicines used during the test).    FOLLOW UP: Our staff will call the number listed on your records 48-72 hours following your procedure to check on you and address any questions or concerns that you may have regarding the information given to you following your procedure. If we do not reach you, we will leave a message.  We will attempt to reach you two times.  During this call, we will ask if you have developed any symptoms of COVID 19. If you develop any symptoms (ie: fever, flu-like symptoms, shortness of breath, cough etc.) before then, please call [phone].  If you test positive for Covid 19 in the 2 weeks post procedure, please call and report this information to [place].    If any biopsies were taken you will be contacted by phone or by letter within the next 1-3 weeks.  Please call us at [phone] if you have not heard about the biopsies in 3 weeks.  SIGNATURES/CONFIDENTIALITY: You and/or your care partner have signed paperwork which will be entered into your electronic medical record.  These signatures attest to the fact that that the information above on your After Visit Summary has been reviewed and is understood.  Full responsibility of the confidentiality of this discharge information lies with you and/or your care-partner.

## 2020-10-17 NOTE — Progress Notes (Signed)
Called to room to assist during endoscopic procedure.  Patient ID and intended procedure confirmed with present staff. Received instructions for my participation in the procedure from the performing physician.  

## 2020-10-17 NOTE — Progress Notes (Signed)
pt tolerated well. VSS. awake and to recovery. Report given to RN. Bite block left insitu to recovery. 

## 2020-10-17 NOTE — Op Note (Signed)
[facility] Patient Name: Gina Hopkins Procedure Date: 10/17/2020 10:25 AM MRN: [id number] Endoscopist: Gerrit Heck , MD Age: 66 Referring MD:  Date of Birth: 1955/06/27 Gender: Female Account #: [id number] Procedure:                Colonoscopy Indications:              Generalized abdominal pain, Change in bowel habits,                            Constipation, Weight loss Medicines:                Monitored Anesthesia Care Procedure:                Pre-Anesthesia Assessment:                           - Prior to the procedure, a History and Physical                            was performed, and patient medications and                            allergies were reviewed. The patient's tolerance of                            previous anesthesia was also reviewed. The risks                            and benefits of the procedure and the sedation                            options and risks were discussed with the patient.                            All questions were answered, and informed consent                            was obtained. Prior Anticoagulants: The patient has                            taken no previous anticoagulant or antiplatelet                            agents. ASA Grade Assessment: II - A patient with                            mild systemic disease. After reviewing the risks                            and benefits, the patient was deemed in                            satisfactory condition to undergo the procedure.  After obtaining informed consent, the colonoscope                            was passed under direct vision. Throughout the                            procedure, the patient's blood pressure, pulse, and                            oxygen saturations were monitored continuously. The                            Olympus CF-HQ190L [id number]) Colonoscope was                            introduced through the anus and  advanced to the the                            terminal ileum. The colonoscopy was performed                            without difficulty. The patient tolerated the                            procedure well. The quality of the bowel                            preparation was good. The terminal ileum, ileocecal                            valve, appendiceal orifice, and rectum were                            photographed. Scope In: 10:56:33 AM Scope Out: 11:13:51 AM Scope Withdrawal Time: 0 hours 13 minutes 36 seconds  Total Procedure Duration: 0 hours 17 minutes 18 seconds  Findings:                 The perianal and digital rectal examinations were                            normal.                           Three sessile polyps were found in the sigmoid                            colon and ascending colon. The polyps were 2 to 3                            mm in size. These polyps were removed with a cold                            biopsy forceps. Resection and retrieval were  complete. Estimated blood loss was minimal.                           Two sessile polyps were found in the rectum. The                            polyps were 1 to 3 mm in size. These polyps were                            removed with a cold biopsy forceps. Resection and                            retrieval were complete. Estimated blood loss was                            minimal.                           The exam was otherwise normal throughout the                            remainder of the colon. No areas of mucosal                            erythema, edema, erosions, or luminal                            narrowing/stricturing.                           The retroflexed view of the distal rectum and anal                            verge was normal and showed no anal or rectal                            abnormalities.                           The terminal ileum appeared  normal. Complications:            No immediate complications. Estimated Blood Loss:     Estimated blood loss was minimal. Impression:               - Three 2 to 3 mm polyps in the sigmoid colon and                            in the ascending colon, removed with a cold biopsy                            forceps. Resected and retrieved.                           - Two 1 to 3 mm polyps in the rectum, removed with  a cold biopsy forceps. Resected and retrieved.                           - The distal rectum and anal verge are normal on                            retroflexion view.                           - The examined portion of the ileum was normal. Recommendation:           - Patient has a contact number available for                            emergencies. The signs and symptoms of potential                            delayed complications were discussed with the                            patient. Return to normal activities tomorrow.                            Written discharge instructions were provided to the                            patient.                           - Resume previous diet.                           - Continue present medications.                           - Await pathology results.                           - Repeat colonoscopy for surveillance based on                            pathology results.                           - Return to GI office in 2 months. Doristine Locks, MD 10/17/2020 11:25:33 AM

## 2020-10-17 NOTE — Op Note (Signed)
[facility] Patient Name: Gina Hopkins Procedure Date: 10/17/2020 10:30 AM MRN: [id number] Endoscopist: Gerrit Heck , MD Age: 65 Referring MD:  Date of Birth: 05-14-1956 Gender: Female Account #: [id number] Procedure:                Upper GI endoscopy Indications:              Generalized abdominal pain, Early satiety,                            Decreased appetite, Weight loss Medicines:                Monitored Anesthesia Care Procedure:                Pre-Anesthesia Assessment:                           - Prior to the procedure, a History and Physical                            was performed, and patient medications and                            allergies were reviewed. The patient's tolerance of                            previous anesthesia was also reviewed. The risks                            and benefits of the procedure and the sedation                            options and risks were discussed with the patient.                            All questions were answered, and informed consent                            was obtained. Prior Anticoagulants: The patient has                            taken no previous anticoagulant or antiplatelet                            agents. ASA Grade Assessment: II - A patient with                            mild systemic disease. After reviewing the risks                            and benefits, the patient was deemed in                            satisfactory condition to undergo the procedure.  After obtaining informed consent, the endoscope was                            passed under direct vision. Throughout the                            procedure, the patient's blood pressure, pulse, and                            oxygen saturations were monitored continuously. The                            Endoscope was introduced through the mouth, and                            advanced to the second part  of duodenum. The upper                            GI endoscopy was accomplished without difficulty.                            The patient tolerated the procedure well. Scope In: Scope Out: Findings:                 The examined esophagus was normal.                           The Z-line was regular and was found 40 cm from the                            incisors.                           Multiple non-bleeding superficial gastric ulcers                            with no stigmata of bleeding were found in the                            gastric antrum and in the prepyloric region of the                            stomach. The largest lesion was 5 mm in largest                            dimension. There was mild surrounding erythema.                            Biopsies were taken with a cold forceps for                            histology. Estimated blood loss was minimal.  Segmental mild inflammation characterized by                            erythema was found in the gastric fundus and in the                            gastric body. Biopsies were taken with a cold                            forceps for Helicobacter pylori testing. Estimated                            blood loss was minimal.                           Localized mildly erythematous mucosa without active                            bleeding was found in the duodenal bulb. Biopsies                            were taken with a cold forceps for histology.                            Estimated blood loss was minimal.                           The first portion of the duodenum and second                            portion of the duodenum were normal. Biopsies for                            histology were taken with a cold forceps for                            evaluation of celiac disease. Estimated blood loss                            was minimal. Complications:            No immediate  complications. Estimated Blood Loss:     Estimated blood loss was minimal. Impression:               - Normal esophagus.                           - Z-line regular, 40 cm from the incisors.                           - Non-bleeding gastric ulcers with no stigmata of                            bleeding. Biopsied.                           -  Gastritis. Biopsied.                           - Erythematous duodenopathy. Biopsied.                           - Normal first portion of the duodenum and second                            portion of the duodenum. Biopsied. Recommendation:           - Patient has a contact number available for                            emergencies. The signs and symptoms of potential                            delayed complications were discussed with the                            patient. Return to normal activities tomorrow.                            Written discharge instructions were provided to the                            patient.                           - Resume previous diet.                           - Continue present medications.                           - Await pathology results.                           - Repeat upper endoscopy in 6 weeks to check                            healing.                           - Use Protonix (pantoprazole) 40 mg PO BID for 6                            weeks.                           - Perform a colonoscopy today.                           - If continued symptoms despite high dose PPI, low                            threshold for cross sectional imaging with CT  abdomen/pelvis. Gerrit Heck, MD 10/17/2020 11:22:05 AM

## 2020-10-19 ENCOUNTER — Telehealth: Payer: Self-pay | Admitting: *Deleted

## 2020-10-19 ENCOUNTER — Telehealth: Payer: Self-pay

## 2020-10-19 NOTE — Telephone Encounter (Signed)
  Follow up Call-  Call back number 10/17/2020  Post procedure Call Back phone  # [phone]  Permission to leave phone message Yes  Some recent data might be hidden     Patient questions:  Do you have a fever, pain , or abdominal swelling? Yes.   Pain Score  8 *  Pt. Reports she has a stomach ache, she rates at an "8" on the pain scale, for about 2 hours after eating.  She says this is not like discomfort she experienced prior to her procedure.   She has begun taking the Protonix 40 mg. Bid.  Have you tolerated food without any problems? No.  Have you been able to return to your normal activities? Yes.    Do you have any questions about your discharge instructions: Diet   No. Medications  No. Follow up visit  No.  Do you have questions or concerns about your Care? Yes.  I reviewed with pt. The anticipated course of f/u in the notes at the end of her upper endoscopy report, and that I would convey our conversation to Dr. Bryan Lemma, and to call should her symptoms get worse.  Actions: * If pain score is 4 or above: Physician/ provider Notified : Vito Cirigliano, DO.

## 2020-10-19 NOTE — Telephone Encounter (Signed)
Agree with plan as outlined, treating PUD with high dose PPI as prescribed at time of EGD. To f/u in the GI clinic.

## 2020-10-19 NOTE — Telephone Encounter (Signed)
First attempt, left VM.  

## 2020-11-01 ENCOUNTER — Telehealth: Payer: Self-pay | Admitting: General Surgery

## 2020-11-01 ENCOUNTER — Encounter: Payer: Self-pay | Admitting: General Surgery

## 2020-11-01 DIAGNOSIS — A048 Other specified bacterial intestinal infections: Secondary | ICD-10-CM

## 2020-11-01 MED ORDER — BISMUTH SUBSALICYLATE 262 MG PO CHEW: 524.0000 mg | CHEWABLE_TABLET | Freq: Four times a day (QID) | ORAL | 0 refills | Status: AC

## 2020-11-01 MED ORDER — OMEPRAZOLE 20 MG PO CPDR: 20.0000 mg | DELAYED_RELEASE_CAPSULE | Freq: Two times a day (BID) | ORAL | 0 refills | Status: DC

## 2020-11-01 MED ORDER — DOXYCYCLINE MONOHYDRATE 100 MG PO TABS: 100.0000 mg | ORAL_TABLET | Freq: Two times a day (BID) | ORAL | 0 refills | Status: AC

## 2020-11-01 MED ORDER — METRONIDAZOLE 250 MG PO TABS: 250.0000 mg | ORAL_TABLET | Freq: Four times a day (QID) | ORAL | 0 refills | Status: AC

## 2020-11-01 NOTE — Telephone Encounter (Signed)
Went over results to [facility] with the patient, patient verbalized understanding and knows her prescriptions were sent to [facility], explained to her what h. Pylori is and that she will need a follow up stool test in 6 weeks from the day she starts the medication to check for eradication. Explained to the patient I would send her a letter with all of the findings in the mail.

## 2020-11-01 NOTE — Telephone Encounter (Deleted)
-----   Message from Maben, DO sent at 11/01/2020  1:18 PM EDT ----- The biopsies from the recent upper GI Endoscopy were notable for H. Pylori gastritis, and will plan on treating with quad therapy as below. Please confirm no medication allergies to the prescribed regimen.   1) Omeprazole 20 mg 2 times a day x 14 d 2) Pepto Bismol 2 tabs (262 mg each) 4 times a day x 14 d 3) Metronidazole 250 mg 4 times a day x 14 d 4) doxycycline 100 mg 2 times a day x 14 d  After 14 days, ok to stop omeprazole.  4 weeks after treatment completed, check H. Pylori stool antigen to confirm eradication (must be off acid suppression therapy)  Dx: H. Pylori gastritis  Colonoscopy was notable for tubular adenomas x1 and the remainder of the polyps were benign hyperplastic polyps.  Recommend repeat colonoscopy in 7 years for ongoing polyp surveillance.

## 2020-11-01 NOTE — Telephone Encounter (Signed)
-----   Message from Vito Cirigliano V, DO sent at 11/01/2020  1:18 PM EDT ----- The biopsies from the recent upper GI Endoscopy were notable for H. Pylori gastritis, and will plan on treating with quad therapy as below. Please confirm no medication allergies to the prescribed regimen.   1) Omeprazole 20 mg 2 times a day x 14 d 2) Pepto Bismol 2 tabs (262 mg each) 4 times a day x 14 d 3) Metronidazole 250 mg 4 times a day x 14 d 4) doxycycline 100 mg 2 times a day x 14 d  After 14 days, ok to stop omeprazole.  4 weeks after treatment completed, check H. Pylori stool antigen to confirm eradication (must be off acid suppression therapy)  Dx: H. Pylori gastritis  Colonoscopy was notable for tubular adenomas x1 and the remainder of the polyps were benign hyperplastic polyps.  Recommend repeat colonoscopy in 7 years for ongoing polyp surveillance.  

## 2020-11-01 NOTE — Telephone Encounter (Signed)
Left a message for the patient to contact the office back regarding her ECL. Sent medications to pharmacy for the erradication of h.Pylori, also place an order for stool antigen in 6 weeks.

## 2020-11-14 NOTE — Telephone Encounter (Signed)
The patient contacted me back and stated she will come [facility] 12/13/2020 to pick up stool kit. She would like to wait to schedule her next EGD after results are back.

## 2020-11-14 NOTE — Telephone Encounter (Signed)
Left voicemail for patient to contact the office back.

## 2020-11-14 NOTE — Telephone Encounter (Signed)
Inbound call from patient. States she needs clarification on timeline for medications and also if she will need a repeat colonoscopy. Best contact number [phone]

## 2020-12-18 ENCOUNTER — Other Ambulatory Visit: Payer: Self-pay

## 2020-12-18 ENCOUNTER — Other Ambulatory Visit: Payer: BC Managed Care – PPO

## 2020-12-18 DIAGNOSIS — A048 Other specified bacterial intestinal infections: Secondary | ICD-10-CM

## 2020-12-19 LAB — HELICOBACTER PYLORI  SPECIAL ANTIGEN
MICRO NUMBER:: 12053550
SPECIMEN QUALITY: ADEQUATE

## 2020-12-27 LAB — HM MAMMOGRAPHY

## 2021-01-03 ENCOUNTER — Other Ambulatory Visit: Payer: Self-pay | Admitting: Family Medicine

## 2021-01-03 DIAGNOSIS — J301 Allergic rhinitis due to pollen: Secondary | ICD-10-CM

## 2021-01-03 DIAGNOSIS — I10 Essential (primary) hypertension: Secondary | ICD-10-CM

## 2021-01-03 MED ORDER — FLUTICASONE PROPIONATE 50 MCG/ACT NA SUSP: 1.0000 | Freq: Every day | NASAL | 4 refills | Status: DC

## 2021-01-03 NOTE — Addendum Note (Signed)
Addended by: Teddy Spike on: 01/03/2021 04:17 PM   Modules accepted: Orders

## 2021-02-09 ENCOUNTER — Encounter: Payer: Self-pay | Admitting: Family Medicine

## 2021-03-27 ENCOUNTER — Encounter: Payer: Self-pay | Admitting: Gastroenterology

## 2021-04-24 ENCOUNTER — Telehealth (HOSPITAL_BASED_OUTPATIENT_CLINIC_OR_DEPARTMENT_OTHER): Payer: Self-pay

## 2021-04-24 ENCOUNTER — Other Ambulatory Visit: Payer: Self-pay

## 2021-04-24 ENCOUNTER — Encounter: Payer: Self-pay | Admitting: Family Medicine

## 2021-04-24 ENCOUNTER — Ambulatory Visit (INDEPENDENT_AMBULATORY_CARE_PROVIDER_SITE_OTHER): Payer: Medicare Other | Admitting: Family Medicine

## 2021-04-24 VITALS — BP 127/67 | HR 66 | Temp 98.4°F | Ht 62.0 in | Wt 129.0 lb

## 2021-04-24 DIAGNOSIS — R519 Headache, unspecified: Secondary | ICD-10-CM

## 2021-04-24 DIAGNOSIS — Z13820 Encounter for screening for osteoporosis: Secondary | ICD-10-CM | POA: Diagnosis not present

## 2021-04-24 DIAGNOSIS — I10 Essential (primary) hypertension: Secondary | ICD-10-CM | POA: Diagnosis not present

## 2021-04-24 DIAGNOSIS — R0981 Nasal congestion: Secondary | ICD-10-CM

## 2021-04-24 DIAGNOSIS — Z780 Asymptomatic menopausal state: Secondary | ICD-10-CM

## 2021-04-24 DIAGNOSIS — Z23 Encounter for immunization: Secondary | ICD-10-CM | POA: Diagnosis not present

## 2021-04-24 NOTE — Progress Notes (Signed)
Established Patient Office Visit  Subjective:  Patient ID: Gina Hopkins, female    DOB: 09-01-1955  Age: 65 y.o. MRN: [id number]  CC:  Chief Complaint  Patient presents with   Hypertension   Flu Vaccine     HPI Gina Hopkins presents for   Hypertension- Pt denies chest pain, SOB, dizziness, or heart palpitations.  Taking meds as directed w/o problems.  Denies medication side effects.    C/O pain over nasal bridge and feeling tight and congested over that area since had RSV about 6 weeks ago.  She also reports swelling and discharge from her left eye over the last year infectious been seeing her eye doctor regularly and he felt like it was a clogged tear duct but she is also had a lot of swelling over that left maxillary cheek area and around the eye.  That has been going on for about a year.  She says she has a lot more pain and discomfort on the left side of her face as well.  No fevers.  Has been using her fluticasone daily.  She feels like she gets more drainage on the right side of her face and says she is noticing some swollen lymph nodes coming and going more so on the right side of her neck.  Due for DEXA.    Past Medical History:  Diagnosis Date   History of colon polyps    Hypertension    Seasonal allergies    Sinus infection     Past Surgical History:  Procedure Laterality Date   ABDOMINAL HYSTERECTOMY  2006   and one ovary   COLONOSCOPY  02/11/2017   ESOPHAGOGASTRODUODENOSCOPY     Years ago per patient    TOTAL ABDOMINAL HYSTERECTOMY  2006   TUBAL LIGATION      Family History  Problem Relation Age of Onset   Heart attack Father    Hypertension Sister    Breast cancer Sister 51   Hypertension Other    Hypertension Mother    Breast cancer Sister 12   Lung cancer Nephew 58   Colon cancer Neg Hx    Esophageal cancer Neg Hx     Social History   Socioeconomic History   Marital status: Married    Spouse name: Not on file   Number of children:  Not on file   Years of education: Not on file   Highest education level: Not on file  Occupational History   Occupation: Retired    Comment: [employer]  Tobacco Use   Smoking status: Every Day    Packs/day: 0.25    Types: Cigarettes   Smokeless tobacco: Never  Vaping Use   Vaping Use: Never used  Substance and Sexual Activity   Alcohol use: No   Drug use: No   Sexual activity: Not on file  Other Topics Concern   Not on file  Social History Narrative   Works at [employer].    Social Determinants of Health   Financial Resource Strain: Not on file  Food Insecurity: Not on file  Transportation Needs: Not on file  Physical Activity: Not on file  Stress: Not on file  Social Connections: Not on file  Intimate Partner Violence: Not on file    Outpatient Medications Prior to Visit  Medication Sig Dispense Refill   atorvastatin (LIPITOR) 20 MG tablet Take 1 tablet (20 mg total) by mouth at bedtime. 90 tablet 3   fluticasone (FLONASE) 50 MCG/ACT  nasal spray Place 1-2 sprays into both nostrils daily. 48 mL 4   lisinopril-hydrochlorothiazide (ZESTORETIC) 10-12.5 MG tablet TAKE 1 TABLET BY MOUTH EVERY DAY 90 tablet 1   pantoprazole (PROTONIX) 40 MG tablet Take 1 tablet (40 mg total) by mouth 2 (two) times daily. Take daily for 6 weeks, repeat upper endoscopy at that time. 90 tablet 3   Polyethyl Glycol-Propyl Glycol (SYSTANE ULTRA PF OP) Apply to eye 4 (four) times daily.     omeprazole (PRILOSEC) 20 MG capsule Take 1 capsule (20 mg total) by mouth 2 (two) times daily before a meal for 14 days. 28 capsule 0   No facility-administered medications prior to visit.    Allergies  Allergen Reactions   Hydrochlorothiazide Nausea Only   Propoxyphene N-Acetaminophen     REACTION: nausea    ROS Review of Systems    Objective:    Physical Exam Vitals reviewed.  Constitutional:      Appearance: Normal appearance. She is well-developed.  HENT:     Head:  Normocephalic and atraumatic.     Comments: She does have some swelling over the left maxillary sinus area and underneath the left eye compared to the right side.  No erythema.  Tenderness over the nasal bridge. Eyes:     Conjunctiva/sclera: Conjunctivae normal.  Cardiovascular:     Rate and Rhythm: Normal rate and regular rhythm.     Heart sounds: Normal heart sounds.  Pulmonary:     Effort: Pulmonary effort is normal.     Breath sounds: Normal breath sounds.  Skin:    General: Skin is warm and dry.  Neurological:     Mental Status: She is alert and oriented to person, place, and time.  Psychiatric:        Behavior: Behavior normal.    BP 127/67   Pulse 66   Temp 98.4 F (36.9 C)   Ht 5\' 2"  (1.575 m)   Wt 129 lb (58.5 kg)   SpO2 100%   BMI 23.59 kg/m  Wt Readings from Last 3 Encounters:  04/24/21 129 lb (58.5 kg)  10/17/20 125 lb (56.7 kg)  09/28/20 122 lb (55.3 kg)     Health Maintenance Due  Topic Date Due   DEXA SCAN  Never done    There are no preventive care reminders to display for this patient.  Lab Results  Component Value Date   TSH 3.33 08/31/2020   Lab Results  Component Value Date   WBC 4.7 08/31/2020   HGB 12.7 08/31/2020   HCT 38.0 08/31/2020   MCV 95.6 08/31/2020   PLT 243.0 08/31/2020   Lab Results  Component Value Date   NA 140 08/09/2020   K 3.7 08/09/2020   CO2 32 08/09/2020   GLUCOSE 89 08/09/2020   BUN 20 08/09/2020   CREATININE 1.02 (H) 08/09/2020   BILITOT 0.7 08/09/2020   ALKPHOS 73 02/13/2016   AST 13 08/09/2020   ALT 7 08/09/2020   PROT 6.8 08/09/2020   ALBUMIN 3.9 02/13/2016   CALCIUM 10.2 08/09/2020   Lab Results  Component Value Date   CHOL 225 (H) 08/09/2020   Lab Results  Component Value Date   HDL 67 08/09/2020   Lab Results  Component Value Date   LDLCALC 142 (H) 08/09/2020   Lab Results  Component Value Date   TRIG 66 08/09/2020   Lab Results  Component Value Date   CHOLHDL 3.4 08/09/2020   No  results found for: HGBA1C  Assessment & Plan:   Problem List Items Addressed This Visit       Cardiovascular and Mediastinum   HYPERTENSION, BENIGN - Primary    Well controlled. Continue current regimen. Follow up in  6 mo due for BMP today.       Relevant Orders   BASIC METABOLIC PANEL WITH GFR   Other Visit Diagnoses     Need for influenza vaccination       Relevant Orders   Flu Vaccine QUAD High Dose(Fluad) (Completed)   Screening for osteoporosis       Relevant Orders   DG Bone Density   Post-menopausal       Relevant Orders   DG Bone Density   Left facial pain       Relevant Orders   CT Maxillofacial WO CM   Ambulatory referral to ENT   Nasal congestion       Relevant Orders   Ambulatory referral to ENT      Flu vac given today.  Due for pneumonia and shingles as well but she does want to separate those out.  So recommend that she come back in a couple of weeks for the Prevnar 20.  And encouraged her to wait till January for the shingles vaccine as the co-pay price should be coming down after the new year because of new legislation. Due for DEXA too.   Left facial pain and swelling -I am concerned that she could have a chronic sinusitis or may Beavan polyps.  She has been using her fluticasone regularly for months.  Not really getting a lot of relief had like to move forward with a sinus CT and referral to ENT.  No orders of the defined types were placed in this encounter.   Follow-up: Return in about 6 months (around 10/22/2021) for Hypertension.    Beatrice Lecher, MD

## 2021-04-24 NOTE — Assessment & Plan Note (Signed)
Well controlled. Continue current regimen. Follow up in  6 mo due for BMP today.

## 2021-04-24 NOTE — Patient Instructions (Signed)
Influenza (Flu) Vaccine (Inactivated or Recombinant): What You Need to Know 1. Why get vaccinated? Influenza vaccine can prevent influenza (flu). Flu is a contagious disease that spreads around the [place] every year, usually between October and May. Anyone can get the flu, but it is more dangerous for some people. Infants and young children, people 65 years and older, pregnant people, and people with certain health conditions or a weakened immune system are at greatest risk of flu complications. Pneumonia, bronchitis, sinus infections, and ear infections are examples of flu-related complications. If you have a medical condition, such as heart disease, cancer, or diabetes, flu can make it worse. Flu can cause fever and chills, sore throat, muscle aches, fatigue, cough, headache, and runny or stuffy nose. Some people may have vomiting and diarrhea, though this is more common in children than adults. In an average year, thousands of people in the [place] die from flu, and many more are hospitalized. Flu vaccine prevents millions of illnesses and flu-related visits to the doctor each year. 2. Influenza vaccines CDC recommends everyone 6 months and older get vaccinated every flu season. Children 6 months through 8 years of age may need 2 doses during a single flu season. Everyone else needs only 1 dose each flu season. It takes about 2 weeks for protection to develop after vaccination. There are many flu viruses, and they are always changing. Each year a new flu vaccine is made to protect against the influenza viruses believed to be likely to cause disease in the upcoming flu season. Even when the vaccine doesn't exactly match these viruses, it may still provide some protection. Influenza vaccine does not cause flu. Influenza vaccine may be given at the same time as other vaccines. 3. Talk with your health care provider Tell your vaccination provider if the person getting the vaccine: Has had  an allergic reaction after a previous dose of influenza vaccine, or has any severe, life-threatening allergies Has ever had Guillain-Barr Syndrome (also called "GBS") In some cases, your health care provider may decide to postpone influenza vaccination until a future visit. Influenza vaccine can be administered at any time during pregnancy. People who are or will be pregnant during influenza season should receive inactivated influenza vaccine. People with minor illnesses, such as a cold, may be vaccinated. People who are moderately or severely ill should usually wait until they recover before getting influenza vaccine. Your health care provider can give you more information. 4. Risks of a vaccine reaction Soreness, redness, and swelling where the shot is given, fever, muscle aches, and headache can happen after influenza vaccination. There may be a very small increased risk of Guillain-Barr Syndrome (GBS) after inactivated influenza vaccine (the flu shot). Young children who get the flu shot along with pneumococcal vaccine (PCV13) and/or DTaP vaccine at the same time might be slightly more likely to have a seizure caused by fever. Tell your health care provider if a child who is getting flu vaccine has ever had a seizure. People sometimes faint after medical procedures, including vaccination. Tell your provider if you feel dizzy or have vision changes or ringing in the ears. As with any medicine, there is a very remote chance of a vaccine causing a severe allergic reaction, other serious injury, or death. 5. What if there is a serious problem? An allergic reaction could occur after the vaccinated person leaves the clinic. If you see signs of a severe allergic reaction (hives, swelling of the face and throat, difficulty breathing,   a fast heartbeat, dizziness, or weakness), call 9-1-1 and get the person to the nearest hospital. For other signs that concern you, call your health care provider. Adverse  reactions should be reported to the Vaccine Adverse Event Reporting System (VAERS). Your health care provider will usually file this report, or you can do it yourself. Visit the VAERS website at www.vaers.hhs.gov or call 1-800-822-7967. VAERS is only for reporting reactions, and VAERS staff members do not give medical advice. 6. The National Vaccine Injury Compensation Program The National Vaccine Injury Compensation Program (VICP) is a federal program that was created to compensate people who may have been injured by certain vaccines. Claims regarding alleged injury or death due to vaccination have a time limit for filing, which may be as short as two years. Visit the VICP website at www.hrsa.gov/vaccinecompensation or call 1-800-338-2382 to learn about the program and about filing a claim. 7. How can I learn more? Ask your health care provider. Call your local or state health department. Visit the website of the Food and Drug Administration (FDA) for vaccine package inserts and additional information at www.fda.gov/vaccines-blood-biologics/vaccines. Contact the Centers for Disease Control and Prevention (CDC): Call 1-800-232-4636 (1-800-CDC-INFO) or Visit CDC's website at www.cdc.gov/flu. Vaccine Information Statement Inactivated Influenza Vaccine (01/28/2020) This information is not intended to replace advice given to you by your health care provider. Make sure you discuss any questions you have with your health care provider. Document Revised: 03/16/2020 Document Reviewed: 03/16/2020 Elsevier Patient Education  2022 Elsevier Inc.  

## 2021-04-25 LAB — BASIC METABOLIC PANEL WITH GFR
BUN: 16 mg/dL (ref 7–25)
CO2: 33 mmol/L — ABNORMAL HIGH (ref 20–32)
Calcium: 10.2 mg/dL (ref 8.6–10.4)
Chloride: 102 mmol/L (ref 98–110)
Creat: 1 mg/dL (ref 0.50–1.05)
Glucose, Bld: 83 mg/dL (ref 65–99)
Potassium: 3.9 mmol/L (ref 3.5–5.3)
Sodium: 141 mmol/L (ref 135–146)
eGFR: 63 mL/min/{1.73_m2} (ref >=60)

## 2021-04-25 NOTE — Progress Notes (Signed)
Your lab work is within acceptable range and there are no concerning findings.   ?

## 2021-04-26 DIAGNOSIS — J343 Hypertrophy of nasal turbinates: Secondary | ICD-10-CM | POA: Diagnosis not present

## 2021-05-01 ENCOUNTER — Other Ambulatory Visit: Payer: Self-pay

## 2021-05-01 ENCOUNTER — Ambulatory Visit (HOSPITAL_BASED_OUTPATIENT_CLINIC_OR_DEPARTMENT_OTHER)
Admission: RE | Admit: 2021-05-01 | Discharge: 2021-05-01 | Disposition: A | Discharge status: Home / Self Care | Payer: Medicare Other | Source: Ambulatory Visit | Attending: Family Medicine | Admitting: Family Medicine

## 2021-05-01 DIAGNOSIS — Z13820 Encounter for screening for osteoporosis: Secondary | ICD-10-CM | POA: Diagnosis not present

## 2021-05-01 DIAGNOSIS — Z780 Asymptomatic menopausal state: Secondary | ICD-10-CM | POA: Diagnosis not present

## 2021-05-01 NOTE — Progress Notes (Signed)
Call patient: Bone density shows a T score of -0.9 this is consistent with a normal bone density which is great news.  Recommend repeat bone density in 5 to 10 years.

## 2021-05-08 ENCOUNTER — Other Ambulatory Visit: Payer: Self-pay

## 2021-05-08 ENCOUNTER — Ambulatory Visit (INDEPENDENT_AMBULATORY_CARE_PROVIDER_SITE_OTHER): Payer: Medicare Other | Admitting: Family Medicine

## 2021-05-08 VITALS — Temp 98.3°F

## 2021-05-08 DIAGNOSIS — Z23 Encounter for immunization: Secondary | ICD-10-CM | POA: Diagnosis not present

## 2021-05-08 NOTE — Progress Notes (Signed)
Agree with documentation as above.   Gina Hagarty, MD  

## 2021-05-08 NOTE — Progress Notes (Signed)
Patient is here for the Prevnar 20 injection. Patient tolerated injection well without any complications on the Left Deltoid.   Patient advised to schedule next visit as requested by provider.

## 2021-05-25 ENCOUNTER — Other Ambulatory Visit: Payer: Self-pay | Admitting: Family Medicine

## 2021-05-25 DIAGNOSIS — I10 Essential (primary) hypertension: Secondary | ICD-10-CM

## 2021-06-23 ENCOUNTER — Other Ambulatory Visit: Payer: Self-pay | Admitting: Gastroenterology

## 2021-08-14 ENCOUNTER — Ambulatory Visit (INDEPENDENT_AMBULATORY_CARE_PROVIDER_SITE_OTHER): Payer: No Typology Code available for payment source | Admitting: Family Medicine

## 2021-08-14 ENCOUNTER — Encounter: Payer: Self-pay | Admitting: Family Medicine

## 2021-08-14 ENCOUNTER — Other Ambulatory Visit: Payer: Self-pay

## 2021-08-14 VITALS — BP 92/65 | HR 58 | Resp 16 | Ht 62.0 in | Wt 124.0 lb

## 2021-08-14 DIAGNOSIS — Z0000 Encounter for general adult medical examination without abnormal findings: Secondary | ICD-10-CM

## 2021-08-14 NOTE — Patient Instructions (Addendum)
Please get your shingles vaccine done at the local pharmacy.  Is a 2 part series so you will get your first 1 and then 2 to 6 months later you will get the second one.  Then you will be complete. Work on getting back on track with your exercise routine.  Encourage you to continue to work on quitting smoking.      [name] , Thank you for taking time to come for your Medicare Wellness Visit. I appreciate your ongoing commitment to your health goals. Please review the following plan we discussed and let me know if I can assist you in the future.   These are the goals we discussed:  Goals      Exercise 150 min/wk Moderate Activity     Quit Smoking        This is a list of the screening recommended for you and due dates:  Health Maintenance  Topic Date Due   COVID-19 Vaccine (4 - Booster for [employer] series) 08/30/2021*   Zoster (Shingles) Vaccine (1 of 2) 11/11/2021*   Mammogram  12/28/2022   Colon Cancer Screening  10/18/2027   Tetanus Vaccine  10/11/2029   Pneumonia Vaccine  Completed   Flu Shot  Completed   DEXA scan (bone density measurement)  Completed   Hepatitis C Screening: USPSTF Recommendation to screen - Ages 64-79 yo.  Completed   HIV Screening  Completed   HPV Vaccine  Aged Out  *Topic was postponed. The date shown is not the original due date.

## 2021-08-14 NOTE — Progress Notes (Signed)
Subjective:    Gina Hopkins is a 66 y.o. female who presents for a Welcome to Medicare exam.   Review of Systems ROS is negative.  She is feeling more down lately and stressed.   Not exercising regularly. Has any incontinence issues. Is getting her medications.  She has not been sleeping well.  She was previously taking melatonin and it was helping.  She usually wakes up after about 3 hours and then cannot go back to sleep for another 2 to 3 hours.  She is planning on picking up some more melatonin today.  She has had some increased stressors.  Her husband retired so he is around the house a lot more and her oldest son is actually living with her because he is going through a separation with his wife.        Objective:    Today's Vitals   08/14/21 1526  BP: 92/65  Pulse: (!) 58  Resp: 16  SpO2: 100%  Weight: 124 lb (56.2 kg)  Height: 5\' 2"  (1.575 m)  Body mass index is 22.68 kg/m.  Medications Outpatient Encounter Medications as of 08/14/2021  Medication Sig   atorvastatin (LIPITOR) 20 MG tablet Take 1 tablet (20 mg total) by mouth at bedtime.   fluticasone (FLONASE) 50 MCG/ACT nasal spray Place 1-2 sprays into both nostrils daily.   lisinopril-hydrochlorothiazide (ZESTORETIC) 10-12.5 MG tablet TAKE 1 TABLET BY MOUTH EVERY DAY   pantoprazole (PROTONIX) 40 MG tablet Take 1 tablet (40 mg total) by mouth 2 (two) times daily. Take daily for 6 weeks, repeat upper endoscopy at that time.   Polyethyl Glycol-Propyl Glycol (SYSTANE ULTRA PF OP) Apply to eye 4 (four) times daily.   No facility-administered encounter medications on file as of 08/14/2021.     History: Past Medical History:  Diagnosis Date   History of colon polyps    Hypertension    Seasonal allergies    Sinus infection    Past Surgical History:  Procedure Laterality Date   ABDOMINAL HYSTERECTOMY  2006   and one ovary   COLONOSCOPY  02/11/2017   ESOPHAGOGASTRODUODENOSCOPY     Years ago per patient     TOTAL ABDOMINAL HYSTERECTOMY  2006   TUBAL LIGATION      Family History  Problem Relation Age of Onset   Hypertension Mother    Heart attack Father    Hypertension Sister    Breast cancer Sister 16   Breast cancer Sister 87   Hypertension Sister    Hypertension Sister    Hypertension Brother    Hypertension Brother    Lung cancer Nephew 23   Hypertension Other    Colon cancer Neg Hx    Esophageal cancer Neg Hx    Social History   Occupational History   Occupation: Retired    Comment: [place]   Occupation: Retired  Tobacco Use   Smoking status: Every Day    Packs/day: 0.25    Types: Cigarettes   Smokeless tobacco: Never  Vaping Use   Vaping Use: Never used  Substance and Sexual Activity   Alcohol use: No   Drug use: No   Sexual activity: Not on file    Tobacco Counseling Ready to quit: No Counseling given: Yes   Immunizations and Health Maintenance Immunization History  Administered Date(s) Administered   Fluad Quad(high Dose 65+) 04/24/2021   Influenza Whole 04/24/2009   Influenza,inj,Quad PF,6+ Mos 06/05/2018, 03/18/2019, 04/02/2020   Janssen (J&J) SARS-COV-2 Vaccination 09/05/2019  Moderna Sars-Covid-2 Vaccination 05/29/2020, 01/22/2021   PNEUMOCOCCAL CONJUGATE-20 05/08/2021   Td 07/05/2009   Tdap 10/12/2019   There are no preventive care reminders to display for this patient.   Activities of Daily Living No flowsheet data found.  Physical Exam  (optional), or other factors deemed appropriate based on the beneficiary's medical and social history and current clinical standards. Physical Exam Vitals and nursing note reviewed. Exam conducted with a chaperone present.  Constitutional:      Appearance: She is well-developed.  HENT:     Head: Normocephalic and atraumatic.     Right Ear: External ear normal.     Left Ear: External ear normal.     Nose: Nose normal.  Eyes:     Conjunctiva/sclera: Conjunctivae normal.     Pupils:  Pupils are equal, round, and reactive to light.  Neck:     Thyroid: No thyromegaly.  Cardiovascular:     Rate and Rhythm: Normal rate and regular rhythm.     Heart sounds: Normal heart sounds.  Pulmonary:     Effort: Pulmonary effort is normal.     Breath sounds: Normal breath sounds. No wheezing.  Chest:     Chest wall: No mass.  Breasts:    Right: Normal.     Left: Normal.     Comments: Small seb cyst near the right axilla.  Musculoskeletal:     Cervical back: Neck supple.  Lymphadenopathy:     Cervical: No cervical adenopathy.  Skin:    General: Skin is warm and dry.  Neurological:     Mental Status: She is alert and oriented to person, place, and time.  Psychiatric:        Behavior: Behavior normal.     Advanced Directives:      Assessment:    This is a routine wellness examination for this patient .   Vision/Hearing screen No results found.  Dietary issues and exercise activities discussed:      Goals      Exercise 150 min/wk Moderate Activity     Quit Smoking      Depression Screen PHQ 2/9 Scores 08/14/2021 04/24/2021 09/28/2020 02/09/2020  PHQ - 2 Score 0 0 0 0  PHQ- 9 Score - - - -     Fall Risk Fall Risk  04/24/2021  Falls in the past year? 0  Number falls in past yr: 0  Injury with Fall? 0  Risk for fall due to : -  Follow up Falls evaluation completed    Cognitive Function:     6CIT Screen 08/14/2021  What Year? 0 points  What month? 0 points  What time? 0 points  Count back from 20 0 points  Months in reverse 0 points  Repeat phrase 0 points  Total Score 0    Patient Care Team: Hali Marry, MD as PCP - Kyla Balzarine, MD as Referring Physician (Ophthalmology)     Plan:   Medicare Wellness Exam   I have personally reviewed and noted the following in the patients chart:   Medical and social history Use of alcohol, tobacco or illicit drugs  Current medications and supplements Functional ability and  status Nutritional status Physical activity Advanced directives List of other physicians Hospitalizations, surgeries, and ER visits in previous 12 months Vitals Screenings to include cognitive, depression, and falls Referrals and appointments  EKG today shows rate of 58 bpm, bradycardia with couple of PVCs.  No acute ST-T wave changes.  In addition, I  have reviewed and discussed with patient certain preventive protocols, quality metrics, and best practice recommendations. A written personalized care plan for preventive services as well as general preventive health recommendations were provided to patient.     Beatrice Lecher, MD 08/14/2021

## 2021-08-16 NOTE — Addendum Note (Signed)
Addended by: Narda Rutherford on: 08/16/2021 08:26 AM   Modules accepted: Orders

## 2021-10-23 ENCOUNTER — Ambulatory Visit (INDEPENDENT_AMBULATORY_CARE_PROVIDER_SITE_OTHER): Payer: No Typology Code available for payment source | Admitting: Family Medicine

## 2021-10-23 ENCOUNTER — Encounter: Payer: Self-pay | Admitting: Family Medicine

## 2021-10-23 VITALS — BP 112/73 | HR 74 | Ht 62.0 in | Wt 123.0 lb

## 2021-10-23 DIAGNOSIS — E785 Hyperlipidemia, unspecified: Secondary | ICD-10-CM | POA: Diagnosis not present

## 2021-10-23 DIAGNOSIS — I10 Essential (primary) hypertension: Secondary | ICD-10-CM

## 2021-10-23 DIAGNOSIS — Z720 Tobacco use: Secondary | ICD-10-CM | POA: Diagnosis not present

## 2021-10-23 NOTE — Progress Notes (Signed)
Pt reports that she is doing well. Denies any SOB,CP, palpitations,dizziness,lightheadedness. ? ?Taking medication with no problems, she is walking daily and has cut back on nicotine usage.  ?

## 2021-10-23 NOTE — Assessment & Plan Note (Signed)
Due for updated lipid panel its been a little over a year we will check liver function as well. ?

## 2021-10-23 NOTE — Assessment & Plan Note (Signed)
Well controlled. Continue current regimen. Follow up in  6 mo. Due for labs.  

## 2021-10-23 NOTE — Progress Notes (Signed)
? ?  Established Patient Office Visit ? ?Subjective   ?Patient ID: Gina Hopkins, female    DOB: 1955/07/24  Age: 66 y.o. MRN: [id number] ? ?Chief Complaint  ?Patient presents with  ? Hypertension  ?  Follow up   ? ? ?HPI ? ?Hypertension- Pt denies chest pain, SOB, dizziness, or heart palpitations.  Taking meds as directed w/o problems.  Denies medication side effects.   ? ?Tob abuse -she has been really working on cutting back on her smoking.  She says now instead of waking up and having her coffee and a cigarette she actually gets up makes breakfast starts to do at least a half a mile of her walk.  She has been doing 2 miles per day and says sometimes it is much later in the day before she actually smokes her first cigarette. ? ?Hyperlipidemia - tolerating stating well with no myalgias or significant side effects.  ?Lab Results  ?Component Value Date  ? CHOL 225 (H) 08/09/2020  ? HDL 67 08/09/2020  ? LDLCALC 142 (H) 08/09/2020  ? TRIG 66 08/09/2020  ? CHOLHDL 3.4 08/09/2020  ? ? ? ? ? ? ? ?ROS ? ?  ?Objective:  ?  ? ?BP 112/73   Pulse 74   Ht '5\' 2"'$  (1.575 m)   Wt 123 lb (55.8 kg)   SpO2 100%   BMI 22.50 kg/m?  ? ? ?Physical Exam ?Vitals and nursing note reviewed.  ?Constitutional:   ?   Appearance: She is well-developed.  ?HENT:  ?   Head: Normocephalic and atraumatic.  ?Cardiovascular:  ?   Rate and Rhythm: Normal rate and regular rhythm.  ?   Heart sounds: Normal heart sounds.  ?Pulmonary:  ?   Effort: Pulmonary effort is normal.  ?   Breath sounds: Normal breath sounds.  ?Skin: ?   General: Skin is warm and dry.  ?Neurological:  ?   Mental Status: She is alert and oriented to person, place, and time.  ?Psychiatric:     ?   Behavior: Behavior normal.  ? ? ? ?No results found for any visits on 10/23/21. ? ? ? ?The 10-year ASCVD risk score (Arnett DK, et al., 2019) is: 13.7% ? ?  ?Assessment & Plan:  ? ?Problem List Items Addressed This Visit   ? ?  ? Cardiovascular and Mediastinum  ? HYPERTENSION, BENIGN -  Primary  ?  Well controlled. Continue current regimen. Follow up in  10mo ?Due for labs.  ? ?  ?  ? Relevant Orders  ? Lipid Panel w/reflex Direct LDL  ? COMPLETE METABOLIC PANEL WITH GFR  ?  ? Other  ? Tobacco abuse  ?  Gradually did her on cutting back.  She is doing a great job. ? ?  ?  ? Hyperlipidemia  ?  Due for updated lipid panel its been a little over a year we will check liver function as well. ? ?  ?  ? ? ?Return in about 6 months (around 05/06/2022).  ? ? ?CBeatrice Lecher MD ? ?

## 2021-10-23 NOTE — Assessment & Plan Note (Signed)
Gradually did her on cutting back.  She is doing a great job. ?

## 2021-11-13 DIAGNOSIS — I10 Essential (primary) hypertension: Secondary | ICD-10-CM | POA: Diagnosis not present

## 2021-11-14 ENCOUNTER — Other Ambulatory Visit: Payer: Self-pay

## 2021-11-14 DIAGNOSIS — E785 Hyperlipidemia, unspecified: Secondary | ICD-10-CM

## 2021-11-14 LAB — COMPLETE METABOLIC PANEL WITH GFR
AG Ratio: 1.8 (calc) (ref 1.0–2.5)
ALT: 8 U/L (ref 6–29)
AST: 14 U/L (ref 10–35)
Albumin: 4.5 g/dL (ref 3.6–5.1)
Alkaline phosphatase (APISO): 70 U/L (ref 37–153)
BUN: 14 mg/dL (ref 7–25)
CO2: 29 mmol/L (ref 20–32)
Calcium: 10.5 mg/dL — ABNORMAL HIGH (ref 8.6–10.4)
Chloride: 103 mmol/L (ref 98–110)
Creat: 1.02 mg/dL (ref 0.50–1.05)
Globulin: 2.5 g/dL (calc) (ref 1.9–3.7)
Glucose, Bld: 90 mg/dL (ref 65–99)
Potassium: 4.1 mmol/L (ref 3.5–5.3)
Sodium: 139 mmol/L (ref 135–146)
Total Bilirubin: 1 mg/dL (ref 0.2–1.2)
Total Protein: 7 g/dL (ref 6.1–8.1)
eGFR: 61 mL/min/{1.73_m2} (ref >=60)

## 2021-11-14 LAB — LIPID PANEL W/REFLEX DIRECT LDL
Cholesterol: 234 mg/dL — ABNORMAL HIGH (ref <200)
HDL: 81 mg/dL (ref >=50)
LDL Cholesterol (Calc): 138 mg/dL (calc) — ABNORMAL HIGH
Non-HDL Cholesterol (Calc): 153 mg/dL (calc) — ABNORMAL HIGH (ref <130)
Total CHOL/HDL Ratio: 2.9 (calc) (ref <5.0)
Triglycerides: 61 mg/dL (ref <150)

## 2021-11-14 MED ORDER — ATORVASTATIN CALCIUM 40 MG PO TABS: 40.0000 mg | ORAL_TABLET | Freq: Every day | ORAL | 1 refills | Status: DC

## 2021-11-14 NOTE — Progress Notes (Signed)
Hi Gina Hopkins, LDL cholesterol is still mildly elevated but looking a little better this time around.  Would you be okay increasing your atorvastatin.  I think you would do well by bumping up to 40 mg to see if we can get a little bit better control of your cholesterol.  Metabolic panel looks good.

## 2021-12-24 ENCOUNTER — Other Ambulatory Visit: Payer: Self-pay

## 2021-12-24 DIAGNOSIS — I10 Essential (primary) hypertension: Secondary | ICD-10-CM

## 2021-12-24 MED ORDER — LISINOPRIL-HYDROCHLOROTHIAZIDE 10-12.5 MG PO TABS: 1.0000 | ORAL_TABLET | Freq: Every day | ORAL | 2 refills | Status: DC

## 2021-12-31 DIAGNOSIS — Z1231 Encounter for screening mammogram for malignant neoplasm of breast: Secondary | ICD-10-CM | POA: Diagnosis not present

## 2021-12-31 LAB — HM MAMMOGRAPHY

## 2022-03-09 ENCOUNTER — Encounter: Payer: Self-pay | Admitting: Family Medicine

## 2022-04-10 DIAGNOSIS — H524 Presbyopia: Secondary | ICD-10-CM | POA: Diagnosis not present

## 2022-04-10 DIAGNOSIS — H25813 Combined forms of age-related cataract, bilateral: Secondary | ICD-10-CM | POA: Diagnosis not present

## 2022-04-30 DIAGNOSIS — M1990 Unspecified osteoarthritis, unspecified site: Secondary | ICD-10-CM | POA: Diagnosis not present

## 2022-04-30 DIAGNOSIS — Z6821 Body mass index (BMI) 21.0-21.9, adult: Secondary | ICD-10-CM | POA: Diagnosis not present

## 2022-04-30 DIAGNOSIS — I129 Hypertensive chronic kidney disease with stage 1 through stage 4 chronic kidney disease, or unspecified chronic kidney disease: Secondary | ICD-10-CM | POA: Diagnosis not present

## 2022-04-30 DIAGNOSIS — F17210 Nicotine dependence, cigarettes, uncomplicated: Secondary | ICD-10-CM | POA: Diagnosis not present

## 2022-04-30 DIAGNOSIS — Z008 Encounter for other general examination: Secondary | ICD-10-CM | POA: Diagnosis not present

## 2022-04-30 DIAGNOSIS — E785 Hyperlipidemia, unspecified: Secondary | ICD-10-CM | POA: Diagnosis not present

## 2022-04-30 DIAGNOSIS — N182 Chronic kidney disease, stage 2 (mild): Secondary | ICD-10-CM | POA: Diagnosis not present

## 2022-05-06 ENCOUNTER — Ambulatory Visit (INDEPENDENT_AMBULATORY_CARE_PROVIDER_SITE_OTHER): Payer: No Typology Code available for payment source | Admitting: Family Medicine

## 2022-05-06 ENCOUNTER — Encounter: Payer: Self-pay | Admitting: Family Medicine

## 2022-05-06 VITALS — BP 117/72 | HR 63 | Ht 62.0 in | Wt 118.0 lb

## 2022-05-06 DIAGNOSIS — I10 Essential (primary) hypertension: Secondary | ICD-10-CM | POA: Diagnosis not present

## 2022-05-06 DIAGNOSIS — G43009 Migraine without aura, not intractable, without status migrainosus: Secondary | ICD-10-CM

## 2022-05-06 DIAGNOSIS — F430 Acute stress reaction: Secondary | ICD-10-CM | POA: Diagnosis not present

## 2022-05-06 DIAGNOSIS — E785 Hyperlipidemia, unspecified: Secondary | ICD-10-CM

## 2022-05-06 MED ORDER — ATORVASTATIN CALCIUM 40 MG PO TABS: 40.0000 mg | ORAL_TABLET | Freq: Every day | ORAL | 3 refills | Status: DC

## 2022-05-06 NOTE — Assessment & Plan Note (Signed)
Encouraged her to reach out if at any point she feels like she is feeling overly stressed or overwhelmed.  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  But she feels like it is the right thing to do.

## 2022-05-06 NOTE — Assessment & Plan Note (Signed)
Blood pressure looks phenomenal today.  Due for BMP.

## 2022-05-06 NOTE — Progress Notes (Signed)
   Established Patient Office Visit  Subjective   Patient ID: Gina Hopkins, female    DOB: [birth year]  Age: 66 y.o. MRN: [id number]  Chief Complaint  Patient presents with   Hypertension    HPI  Hypertension- Pt denies chest pain, SOB, dizziness, or heart palpitations.  Taking meds as directed w/o problems.  Denies medication side effects.  Still walking daily for exercise.  F/U migraines -no concerns today.  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  She will be taking over guardianship for her sister as she does not have any other family members that can do it.  This is actually been really stressful for her because her sister does not want to leave her home where she currently lives alone.    ROS    Objective:     BP 117/72   Pulse 63   Ht '5\' 2"'$  (1.575 m)   Wt 118 lb (53.5 kg)   SpO2 99%   BMI 21.58 kg/m    Physical Exam Vitals and nursing note reviewed.  Constitutional:      Appearance: She is well-developed.  HENT:     Head: Normocephalic and atraumatic.  Cardiovascular:     Rate and Rhythm: Normal rate and regular rhythm.     Heart sounds: Normal heart sounds.  Pulmonary:     Effort: Pulmonary effort is normal.     Breath sounds: Normal breath sounds.  Skin:    General: Skin is warm and dry.  Neurological:     Mental Status: She is alert and oriented to person, place, and time.  Psychiatric:        Behavior: Behavior normal.     No results found for any visits on 05/06/22.    The 10-year ASCVD risk score (Arnett DK, et al., 2019) is: 16.3%    Assessment & Plan:   Problem List Items Addressed This Visit       Cardiovascular and Mediastinum   Migraine without aura    Stable.      Relevant Medications   atorvastatin (LIPITOR) 40 MG tablet   HYPERTENSION, BENIGN - Primary    Blood pressure looks phenomenal today.  Due for BMP.      Relevant Medications   atorvastatin (LIPITOR) 40 MG tablet    Other Relevant Orders   BASIC METABOLIC PANEL WITH GFR     Other   Hyperlipidemia    Fill medication.  Tolerating well.      Relevant Medications   atorvastatin (LIPITOR) 40 MG tablet   Acute reaction to stress    Encouraged her to reach out if at any point she feels like she is feeling overly stressed or overwhelmed.  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  But she feels like it is the right thing to do.       Return in about 6 months (around 11/04/2022) for Hypertension.    Beatrice Lecher, MD

## 2022-05-06 NOTE — Assessment & Plan Note (Signed)
Fill medication.  Tolerating well.

## 2022-05-06 NOTE — Assessment & Plan Note (Signed)
Stable

## 2022-05-07 LAB — BASIC METABOLIC PANEL WITH GFR
BUN/Creatinine Ratio: 19 (calc) (ref 6–22)
BUN: 20 mg/dL (ref 7–25)
CO2: 29 mmol/L (ref 20–32)
Calcium: 10.5 mg/dL — ABNORMAL HIGH (ref 8.6–10.4)
Chloride: 102 mmol/L (ref 98–110)
Creat: 1.06 mg/dL — ABNORMAL HIGH (ref 0.50–1.05)
Glucose, Bld: 86 mg/dL (ref 65–99)
Potassium: 4.8 mmol/L (ref 3.5–5.3)
Sodium: 139 mmol/L (ref 135–146)
eGFR: 58 mL/min/{1.73_m2} — ABNORMAL LOW (ref >=60)

## 2022-05-07 NOTE — Progress Notes (Signed)
HI Thara Your kidney function is stable at 1.0. you look a little dry on bloodwork.

## 2022-08-13 ENCOUNTER — Other Ambulatory Visit: Payer: Self-pay | Admitting: Family Medicine

## 2022-08-13 DIAGNOSIS — J301 Allergic rhinitis due to pollen: Secondary | ICD-10-CM

## 2022-08-23 ENCOUNTER — Ambulatory Visit (INDEPENDENT_AMBULATORY_CARE_PROVIDER_SITE_OTHER): Payer: No Typology Code available for payment source | Admitting: Family Medicine

## 2022-08-23 DIAGNOSIS — Z0000 Encounter for general adult medical examination without abnormal findings: Secondary | ICD-10-CM | POA: Diagnosis not present

## 2022-08-23 DIAGNOSIS — Z1231 Encounter for screening mammogram for malignant neoplasm of breast: Secondary | ICD-10-CM

## 2022-08-23 DIAGNOSIS — Z780 Asymptomatic menopausal state: Secondary | ICD-10-CM | POA: Diagnosis not present

## 2022-08-23 NOTE — Progress Notes (Signed)
MEDICARE ANNUAL WELLNESS VISIT  08/23/2022  Telephone Visit Disclaimer This Medicare AWV was conducted by telephone due to national recommendations for restrictions regarding the COVID-19 Pandemic (e.g. social distancing).  I verified, using two identifiers, that I am speaking with Gina Hopkins or their authorized healthcare agent. I discussed the limitations, risks, security, and privacy concerns of performing an evaluation and management service by telephone and the potential availability of an in-person appointment in the future. The patient expressed understanding and agreed to proceed.  Location of Patient: Home  Location of Provider (nurse):  In the phone.  Subjective:    Gina Hopkins is a 67 y.o. female patient of Metheney, Rene Kocher, MD who had a Medicare Annual Wellness Visit today via telephone. Marcellina is Retired and lives with their spouse. she has 2 children. she reports that she is socially active and does interact with friends/family regularly. she is moderately physically active and enjoys watching sports, going to comedy shows, spending time with her grandchild and playing bingo. .  Patient Care Team: Hali Marry, MD as PCP - Kyla Balzarine, MD as Referring Physician (Ophthalmology)     08/23/2022    8:59 AM  Advanced Directives  Does Patient Have a Medical Advance Directive? No  Would patient like information on creating a medical advance directive? No - Patient declined    Hospital Utilization Over the Past 12 Months: # of hospitalizations or ER visits: 0 # of surgeries: 0  Review of Systems    Patient reports that her overall health is unchanged compared to last year.  History obtained from chart review and the patient  Patient Reported Readings (BP, Pulse, CBG, Weight, etc) none  Pain Assessment Pain : No/denies pain     Current Medications & Allergies (verified) Allergies as of 08/23/2022       Reactions    Hydrochlorothiazide Nausea Only   Propoxyphene N-acetaminophen    REACTION: nausea        Medication List        Accurate as of August 23, 2022  9:16 AM. If you have any questions, ask your nurse or doctor.          atorvastatin 40 MG tablet Commonly known as: LIPITOR Take 1 tablet (40 mg total) by mouth daily.   fluticasone 50 MCG/ACT nasal spray Commonly known as: FLONASE PLACE 1-2 SPRAYS INTO BOTH NOSTRILS DAILY.   lisinopril-hydrochlorothiazide 10-12.5 MG tablet Commonly known as: ZESTORETIC Take 1 tablet by mouth daily.   SYSTANE ULTRA PF OP Apply to eye 4 (four) times daily.        History (reviewed): Past Medical History:  Diagnosis Date   Allergy    Seasonal   History of colon polyps    Hypertension    Seasonal allergies    Sinus infection    Past Surgical History:  Procedure Laterality Date   ABDOMINAL HYSTERECTOMY  2006   and one ovary   COLONOSCOPY  02/11/2017   ESOPHAGOGASTRODUODENOSCOPY     Years ago per patient    SMALL INTESTINE SURGERY  10/17/2020   Endoscopy   TOTAL ABDOMINAL HYSTERECTOMY  2006   TUBAL LIGATION     Family History  Problem Relation Age of Onset   Hypertension Mother    Stroke Mother    Heart attack Father    Hypertension Sister    Breast cancer Sister 39   Breast cancer Sister 60   Cancer Sister    Hypertension Sister  Hypertension Sister    Hypertension Brother    Hypertension Brother    Lung cancer Nephew 58   Hypertension Other    Hypertension Sister    Hypertension Sister    Hypertension Sister    Colon cancer Neg Hx    Esophageal cancer Neg Hx    Social History   Socioeconomic History   Marital status: Married    Spouse name: Charles Porro   Number of children: 2   Years of education: 12   Highest education level: 12th grade  Occupational History   Occupation: Retired    Comment: [employer]   Occupation: Retired  Tobacco Use   Smoking status: Every Day    Packs/day: 0.25     Years: 10.00    Total pack years: 2.50    Types: Cigarettes   Smokeless tobacco: Never   Tobacco comments:    Hope to change  this year  Vaping Use   Vaping Use: Never used  Substance and Sexual Activity   Alcohol use: No   Drug use: No   Sexual activity: Not Currently  Other Topics Concern   Not on file  Social History Narrative   Lives with her spouse. She has two children. She has been stressed lately as she has become a legal guardian for her sister who was diagnosed with Dementia. She enjoys watching sports, going to comedy shows, spending time with her grandchild and playing bingo.    Social Determinants of Health   Financial Resource Strain: Low Risk  (08/19/2022)   Overall Financial Resource Strain (CARDIA)    Difficulty of Paying Living Expenses: Not hard at all  Food Insecurity: No Food Insecurity (08/19/2022)   Hunger Vital Sign    Worried About Running Out of Food in the Last Year: Never true    Ran Out of Food in the Last Year: Never true  Transportation Needs: No Transportation Needs (08/19/2022)   PRAPARE - Hydrologist (Medical): No    Lack of Transportation (Non-Medical): No  Physical Activity: Sufficiently Active (08/19/2022)   Exercise Vital Sign    Days of Exercise per Week: 5 days    Minutes of Exercise per Session: 30 min  Stress: Stress Concern Present (08/19/2022)   [place]    Feeling of Stress : Rather much  Social Connections: Unknown (08/19/2022)   Social Connection and Isolation Panel [NHANES]    Frequency of Communication with Friends and Family: More than three times a week    Frequency of Social Gatherings with Friends and Family: More than three times a week    Attends Religious Services: Not on file    Active Member of Clubs or Organizations: No    Attends Archivist Meetings: Never    Marital Status: Married    Activities of Daily  Living    08/19/2022   10:25 AM  In your present state of health, do you have any difficulty performing the following activities:  Hearing? 0  Vision? 0  Difficulty concentrating or making decisions? 0  Walking or climbing stairs? 0  Dressing or bathing? 0  Doing errands, shopping? 0  Preparing Food and eating ? N  Using the Toilet? N  In the past six months, have you accidently leaked urine? N  Do you have problems with loss of bowel control? N  Managing your Medications? N  Managing your Finances? N  Housekeeping or managing your  Housekeeping? N    Patient Education/ Literacy How often do you need to have someone help you when you read instructions, pamphlets, or other written materials from your doctor or pharmacy?: 1 - Never What is the last grade level you completed in school?: 12th grade  Exercise Current Exercise Habits: Home exercise routine, Type of exercise: walking, Time (Minutes): 30, Frequency (Times/Week): 5, Weekly Exercise (Minutes/Week): 150, Intensity: Moderate, Exercise limited by: None identified  Diet Patient reports consuming 2 meals a day and 1 snack(s) a day Patient reports that her primary diet is: Regular Patient reports that she does have regular access to food.   Depression Screen    08/23/2022    8:59 AM 10/23/2021    9:59 AM 08/14/2021    5:52 PM 04/24/2021    9:49 AM 09/28/2020   11:23 AM 02/09/2020    9:09 AM 02/02/2019   10:43 AM  PHQ 2/9 Scores  PHQ - 2 Score 0 0 0 0 0 0 0  PHQ- 9 Score  0     3     Fall Risk    08/23/2022    8:59 AM 08/19/2022   10:25 AM 10/23/2021    9:59 AM 04/24/2021    9:49 AM 09/28/2020   11:23 AM  Fall Risk   Falls in the past year? 0 0 0 0 0  Number falls in past yr: 0 0 0 0   Injury with Fall? 0  0 0   Risk for fall due to : No Fall Risks  No Fall Risks  No Fall Risks  Follow up Falls evaluation completed  Falls prevention discussed Falls evaluation completed Falls evaluation completed     Objective:  Irisa Leclear  Stoney seemed alert and oriented and she participated appropriately during our telephone visit.  Blood Pressure Weight BMI  BP Readings from Last 3 Encounters:  05/06/22 117/72  10/23/21 112/73  08/14/21 92/65   Wt Readings from Last 3 Encounters:  05/06/22 118 lb (53.5 kg)  10/23/21 123 lb (55.8 kg)  08/14/21 124 lb (56.2 kg)   BMI Readings from Last 1 Encounters:  05/06/22 21.58 kg/m    *Unable to obtain current vital signs, weight, and BMI due to telephone visit type  Hearing/Vision  Mikaylie did not seem to have difficulty with hearing/understanding during the telephone conversation Reports that she has had a formal eye exam by an eye care professional within the past year Reports that she has not had a formal hearing evaluation within the past year *Unable to fully assess hearing and vision during telephone visit type  Cognitive Function:    08/23/2022    9:06 AM 08/14/2021    3:46 PM  6CIT Screen  What Year? 0 points 0 points  What month? 0 points 0 points  What time? 0 points 0 points  Count back from 20 0 points 0 points  Months in reverse 0 points 0 points  Repeat phrase 0 points 0 points  Total Score 0 points 0 points   (Normal:0-7, Significant for Dysfunction: >8)  Normal Cognitive Function Screening: Yes   Immunization & Health Maintenance Record Immunization History  Administered Date(s) Administered   Fluad Quad(high Dose 65+) 04/24/2021, 03/28/2022   Influenza Whole 04/24/2009   Influenza,inj,Quad PF,6+ Mos 06/05/2018, 03/18/2019, 04/02/2020   Janssen (J&J) SARS-COV-2 Vaccination 09/05/2019   Moderna Sars-Covid-2 Vaccination 05/29/2020, 01/22/2021   PNEUMOCOCCAL CONJUGATE-20 05/08/2021   Td 07/05/2009   Tdap 10/12/2019   Unspecified SARS-COV-2 Vaccination 03/28/2022  Zoster Recombinat (Shingrix) 01/07/2022    Health Maintenance  Topic Date Due   COVID-19 Vaccine (5 - 2023-24 season) 09/08/2022 (Originally 05/23/2022)   Zoster Vaccines- Shingrix  (2 of 2) 06/06/2023 (Originally 03/04/2022)   Medicare Annual Wellness ([place])  08/23/2023   MAMMOGRAM  01/01/2024   COLONOSCOPY (Pts 45-42yr Insurance coverage will need to be confirmed)  10/18/2027   DTaP/Tdap/Td (3 - Td or Tdap) 10/11/2029   Pneumonia Vaccine 67 Years old  Completed   INFLUENZA VACCINE  Completed   DEXA SCAN  Completed   Hepatitis C Screening  Completed   HPV VACCINES  Aged Out       Assessment  This is a routine wellness examination for [employer]  Health Maintenance: Due or Overdue There are no preventive care reminders to display for this patient.   MIrving CopasScales does not need a referral for Community Assistance: Care Management:   no Social Work:    no Prescription Assistance:  no Nutrition/Diabetes Education:  no   Plan:  Personalized Goals  Goals Addressed               This Visit's Progress     Patient Stated (pt-stated)        08/23/2022 AWV Goal: Tobacco Cessation  Smoking cessation instruction/counseling given:  counseled patient on the dangers of tobacco use, advised patient to stop smoking, and reviewed strategies to maximize success  Patient will verbalize understanding of the health risks associated with smoking/tobacco use Lung cancer or lung disease, such as COPD Heart disease. Stroke. Heart attack Infertility Osteoporosis and bone fractures. Patient will create a plan to quit smoking/using tobacco Pick a date to quit.  Write down the reasons why you are quitting and put it where you will see it often. Identify the people, places, things, and activities that make you want to smoke (triggers) and avoid them. Make sure to take these actions: Throw away all cigarettes at home, at work, and in your car. Throw away smoking accessories, such as aScientist, research (medical) Clean your car and make sure to empty the ashtray. Clean your home, including curtains and carpets. Tell your family, friends, and coworkers that you are  quitting. Support from your loved ones can make quitting easier. Talk with your health care provider about your options for quitting smoking. Find out what treatment options are covered by your health insurance. Patient will be able to demonstrate knowledge of tobacco cessation strategies that may maximize success Quitting "cold [place] is more successful than gradually quitting. Attending in-person counseling to help you build problem-solving skills.  Finding resources and support systems that can help you to quit smoking and remain smoke-free after you quit. These resources are most helpful when you use them often. They can include: Online chats with a cSocial worker Telephone quitlines. Printed sFurniture conservator/restorer Support groups or group counseling. Text messaging programs. Mobile phone applications. Taking medicines to help you quit smoking: Nicotine patches, gum, or lozenges. Nicotine inhalers or sprays. Non-nicotine medicine that is taken by mouth. Patient will note get discouraged if the process is difficult Over the next year, patient will stop smoking or using other forms of tobacco  Smoking cessation instruction/counseling given:  counseled patient on the dangers of tobacco use, advised patient to stop smoking, and reviewed strategies to maximize success         Personalized Health Maintenance & Screening Recommendations  Shingles vaccine- patient will schedule that Bone density scan  Lung Cancer Screening Recommended: does not  qualify (Low Dose CT Chest recommended if Age 22-80 years, 20 pack-year currently smoking OR have quit w/in past 15 years) Hepatitis C Screening recommended: no HIV Screening recommended: no  Advanced Directives: Written information was not prepared per patient's request.  Referrals & Orders Orders Placed This Encounter  Procedures   [place] Follow-up with Hali Marry, MD as  planned Schedule 2nd dose of shingles vaccine at the pharmacy. Medicare wellness visit in one year.  Patient will access AVS on my chart.   I have personally reviewed and noted the following in the patient's chart:   Medical and social history Use of alcohol, tobacco or illicit drugs  Current medications and supplements Functional ability and status Nutritional status Physical activity Advanced directives List of other physicians Hospitalizations, surgeries, and ER visits in previous 12 months Vitals Screenings to include cognitive, depression, and falls Referrals and appointments  In addition, I have reviewed and discussed with Gina Copas Niznik certain preventive protocols, quality metrics, and best practice recommendations. A written personalized care plan for preventive services as well as general preventive health recommendations is available and can be mailed to the patient at her request.      Tinnie Gens, RN BSN  08/23/2022

## 2022-08-23 NOTE — Patient Instructions (Addendum)
[facility] Maintenance Summary and Written Plan of Care  Ms. Dahlem ,  Thank you for allowing me to perform your Medicare Annual Wellness Visit and for your ongoing commitment to your health.   Health Maintenance & Immunization History Health Maintenance  Topic Date Due   COVID-19 Vaccine (5 - 2023-24 season) 09/08/2022 (Originally 05/23/2022)   Zoster Vaccines- Shingrix (2 of 2) 06/06/2023 (Originally 03/04/2022)   Medicare Annual Wellness (AWV)  08/23/2023   MAMMOGRAM  01/01/2024   COLONOSCOPY (Pts 45-41yr Insurance coverage will need to be confirmed)  10/18/2027   DTaP/Tdap/Td (3 - Td or Tdap) 10/11/2029   Pneumonia Vaccine 67 Years old  Completed   INFLUENZA VACCINE  Completed   DEXA SCAN  Completed   Hepatitis C Screening  Completed   HPV VACCINES  Aged Out   Immunization History  Administered Date(s) Administered   Fluad Quad(high Dose 65+) 04/24/2021, 03/28/2022   Influenza Whole 04/24/2009   Influenza,inj,Quad PF,6+ Mos 06/05/2018, 03/18/2019, 04/02/2020   Janssen (J&J) SARS-COV-2 Vaccination 09/05/2019   Moderna Sars-Covid-2 Vaccination 05/29/2020, 01/22/2021   PNEUMOCOCCAL CONJUGATE-20 05/08/2021   Td 07/05/2009   Tdap 10/12/2019   Unspecified SARS-COV-2 Vaccination 03/28/2022   Zoster Recombinat (Shingrix) 01/07/2022    These are the patient goals that we discussed:  Goals Addressed               This Visit's Progress     Patient Stated (pt-stated)        08/23/2022 AWV Goal: Tobacco Cessation  Smoking cessation instruction/counseling given:  counseled patient on the dangers of tobacco use, advised patient to stop smoking, and reviewed strategies to maximize success  Patient will verbalize understanding of the health risks associated with smoking/tobacco use Lung cancer or lung disease, such as COPD Heart disease. Stroke. Heart attack Infertility Osteoporosis and bone fractures. Patient will create a plan to quit  smoking/using tobacco Pick a date to quit.  Write down the reasons why you are quitting and put it where you will see it often. Identify the people, places, things, and activities that make you want to smoke (triggers) and avoid them. Make sure to take these actions: Throw away all cigarettes at home, at work, and in your car. Throw away smoking accessories, such as aScientist, research (medical) Clean your car and make sure to empty the ashtray. Clean your home, including curtains and carpets. Tell your family, friends, and coworkers that you are quitting. Support from your loved ones can make quitting easier. Talk with your health care provider about your options for quitting smoking. Find out what treatment options are covered by your health insurance. Patient will be able to demonstrate knowledge of tobacco cessation strategies that may maximize success Quitting "cold [place] is more successful than gradually quitting. Attending in-person counseling to help you build problem-solving skills.  Finding resources and support systems that can help you to quit smoking and remain smoke-free after you quit. These resources are most helpful when you use them often. They can include: Online chats with a cSocial worker Telephone quitlines. Printed sFurniture conservator/restorer Support groups or group counseling. Text messaging programs. Mobile phone applications. Taking medicines to help you quit smoking: Nicotine patches, gum, or lozenges. Nicotine inhalers or sprays. Non-nicotine medicine that is taken by mouth. Patient will note get discouraged if the process is difficult Over the next year, patient will stop smoking or using other forms of tobacco  Smoking cessation instruction/counseling given:  counseled patient on the dangers of tobacco use,  advised patient to stop smoking, and reviewed strategies to maximize success           This is a list of Health Maintenance Items that are overdue or due  now: Shingles vaccine- patient will schedule that Bone density scan   Orders/Referrals Placed Today: Orders Placed This Encounter  Procedures   DEXAScan    Standing Status:   Future    Standing Expiration Date:   08/23/2023    Scheduling Instructions:     Please call patient to schedule. Her last one was on 05/01/21.    Order Specific Question:   Reason for exam:    Answer:   post menopausal    Order Specific Question:   Preferred imaging location?    Answer:   [facility]   Mammogram 3D SCREEN BREAST BILATERAL    Standing Status:   Future    Standing Expiration Date:   08/23/2023    Scheduling Instructions:     Please call patient to schedule. Last one was in July, 2023.    Order Specific Question:   Reason for Exam (SYMPTOM  OR DIAGNOSIS REQUIRED)    Answer:   Breast cancer screening    Order Specific Question:   Preferred imaging location?    Answer:   [facility]   (Contact our referral department at [phone] if you have not spoken with someone about your referral appointment within the next 5 days)    Follow-up Plan Follow-up with Hali Marry, MD as planned Schedule 2nd dose of shingles vaccine at the pharmacy. Medicare wellness visit in one year.  Patient will access AVS on my chart.      Health Maintenance, Female Adopting a healthy lifestyle and getting preventive care are important in promoting health and wellness. Ask your health care provider about: The right schedule for you to have regular tests and exams. Things you can do on your own to prevent diseases and keep yourself healthy. What should I know about diet, weight, and exercise? Eat a healthy diet  Eat a diet that includes plenty of vegetables, fruits, low-fat dairy products, and lean protein. Do not eat a lot of foods that are high in solid fats, added sugars, or sodium. Maintain a healthy weight Body mass index (BMI) is used to identify weight problems. It estimates  body fat based on height and weight. Your health care provider can help determine your BMI and help you achieve or maintain a healthy weight. Get regular exercise Get regular exercise. This is one of the most important things you can do for your health. Most adults should: Exercise for at least 150 minutes each week. The exercise should increase your heart rate and make you sweat (moderate-intensity exercise). Do strengthening exercises at least twice a week. This is in addition to the moderate-intensity exercise. Spend less time sitting. Even light physical activity can be beneficial. Watch cholesterol and blood lipids Have your blood tested for lipids and cholesterol at 67 years of age, then have this test every 5 years. Have your cholesterol levels checked more often if: Your lipid or cholesterol levels are high. You are older than 67 years of age. You are at high risk for heart disease. What should I know about cancer screening? Depending on your health history and family history, you may need to have cancer screening at various ages. This may include screening for: Breast cancer. Cervical cancer. Colorectal cancer. Skin cancer. Lung cancer. What should I know about heart disease, diabetes, and  high blood pressure? Blood pressure and heart disease High blood pressure causes heart disease and increases the risk of stroke. This is more likely to develop in people who have high blood pressure readings or are overweight. Have your blood pressure checked: Every 3-5 years if you are 32-72 years of age. Every year if you are 37 years old or older. Diabetes Have regular diabetes screenings. This checks your fasting blood sugar level. Have the screening done: Once every three years after age 93 if you are at a normal weight and have a low risk for diabetes. More often and at a younger age if you are overweight or have a high risk for diabetes. What should I know about preventing  infection? Hepatitis B If you have a higher risk for hepatitis B, you should be screened for this virus. Talk with your health care provider to find out if you are at risk for hepatitis B infection. Hepatitis C Testing is recommended for: Everyone born from [birth year] through 1965. Anyone with known risk factors for hepatitis C. Sexually transmitted infections (STIs) Get screened for STIs, including gonorrhea and chlamydia, if: You are sexually active and are younger than 67 years of age. You are older than 67 years of age and your health care provider tells you that you are at risk for this type of infection. Your sexual activity has changed since you were last screened, and you are at increased risk for chlamydia or gonorrhea. Ask your health care provider if you are at risk. Ask your health care provider about whether you are at high risk for HIV. Your health care provider may recommend a prescription medicine to help prevent HIV infection. If you choose to take medicine to prevent HIV, you should first get tested for HIV. You should then be tested every 3 months for as long as you are taking the medicine. Pregnancy If you are about to stop having your period (premenopausal) and you may become pregnant, seek counseling before you get pregnant. Take 400 to 800 micrograms (mcg) of folic acid every day if you become pregnant. Ask for birth control (contraception) if you want to prevent pregnancy. Osteoporosis and menopause Osteoporosis is a disease in which the bones lose minerals and strength with aging. This can result in bone fractures. If you are 40 years old or older, or if you are at risk for osteoporosis and fractures, ask your health care provider if you should: Be screened for bone loss. Take a calcium or vitamin D supplement to lower your risk of fractures. Be given hormone replacement therapy (HRT) to treat symptoms of menopause. Follow these instructions at home: Alcohol use Do not  drink alcohol if: Your health care provider tells you not to drink. You are pregnant, may be pregnant, or are planning to become pregnant. If you drink alcohol: Limit how much you have to: 0-1 drink a day. Know how much alcohol is in your drink. In the [place] one drink equals one 12 oz bottle of beer (355 mL), one 5 oz glass of wine (148 mL), or one 1 oz glass of hard liquor (44 mL). Lifestyle Do not use any products that contain nicotine or tobacco. These products include cigarettes, chewing tobacco, and vaping devices, such as e-cigarettes. If you need help quitting, ask your health care provider. Do not use street drugs. Do not share needles. Ask your health care provider for help if you need support or information about quitting drugs. General instructions Schedule regular health, dental,  and eye exams. Stay current with your vaccines. Tell your health care provider if: You often feel depressed. You have ever been abused or do not feel safe at home. Summary Adopting a healthy lifestyle and getting preventive care are important in promoting health and wellness. Follow your health care provider's instructions about healthy diet, exercising, and getting tested or screened for diseases. Follow your health care provider's instructions on monitoring your cholesterol and blood pressure. This information is not intended to replace advice given to you by your health care provider. Make sure you discuss any questions you have with your health care provider. Document Revised: 10/30/2020 Document Reviewed: 10/30/2020 Elsevier Patient Education  [place].

## 2022-09-26 ENCOUNTER — Other Ambulatory Visit: Payer: Self-pay | Admitting: *Deleted

## 2022-09-26 DIAGNOSIS — I10 Essential (primary) hypertension: Secondary | ICD-10-CM

## 2022-09-26 MED ORDER — LISINOPRIL-HYDROCHLOROTHIAZIDE 10-12.5 MG PO TABS: 1.0000 | ORAL_TABLET | Freq: Every day | ORAL | 2 refills | Status: DC

## 2022-11-06 ENCOUNTER — Ambulatory Visit (INDEPENDENT_AMBULATORY_CARE_PROVIDER_SITE_OTHER): Payer: No Typology Code available for payment source | Admitting: Family Medicine

## 2022-11-06 ENCOUNTER — Encounter: Payer: Self-pay | Admitting: Family Medicine

## 2022-11-06 VITALS — BP 110/74 | HR 78 | Wt 112.0 lb

## 2022-11-06 DIAGNOSIS — Z636 Dependent relative needing care at home: Secondary | ICD-10-CM | POA: Diagnosis not present

## 2022-11-06 DIAGNOSIS — I10 Essential (primary) hypertension: Secondary | ICD-10-CM

## 2022-11-06 DIAGNOSIS — F430 Acute stress reaction: Secondary | ICD-10-CM | POA: Diagnosis not present

## 2022-11-06 DIAGNOSIS — Z720 Tobacco use: Secondary | ICD-10-CM | POA: Diagnosis not present

## 2022-11-06 DIAGNOSIS — F5102 Adjustment insomnia: Secondary | ICD-10-CM | POA: Diagnosis not present

## 2022-11-06 DIAGNOSIS — R634 Abnormal weight loss: Secondary | ICD-10-CM | POA: Diagnosis not present

## 2022-11-06 DIAGNOSIS — E785 Hyperlipidemia, unspecified: Secondary | ICD-10-CM | POA: Diagnosis not present

## 2022-11-06 LAB — CBC
MCH: 31.6 pg (ref 27.0–33.0)
RBC: 4.21 10*6/uL (ref 3.80–5.10)
WBC: 4.7 10*3/uL (ref 3.8–10.8)

## 2022-11-06 MED ORDER — TRAZODONE HCL 50 MG PO TABS
25.0000 mg | ORAL_TABLET | Freq: Every evening | ORAL | 1 refills | Status: DC | PRN
Start: 2022-11-06 — End: 2023-01-08

## 2022-11-06 NOTE — Assessment & Plan Note (Signed)
He admits that her smoking has ramped up with her stress levels over the last several months but she is trying to cut back again.  We discussed options to help her quit including nicotine patches, Wellbutrin and Chantix.  Encouraged her to reach out if at any point she is interested in 1 of these options.

## 2022-11-06 NOTE — Assessment & Plan Note (Signed)
Gust option of therapy/counseling versus medication to help with mood and improve appetite.  She feels like things are, coming more to a close and that is been getting better so she wants to hold off on those things.  We did discuss trying to get more calories and daily to improve her weight as she has lost about 6 pounds.

## 2022-11-06 NOTE — Assessment & Plan Note (Signed)
Gust options.  She does try to go to bed around the same time but usually wakes up in the middle the night and then has significant difficulty going back to sleep.  We discussed trazodone as a potential option.  It may help with sleep and slightly with mood as well

## 2022-11-06 NOTE — Assessment & Plan Note (Signed)
Well controlled. Continue current regimen. Follow up in  6 mo  

## 2022-11-06 NOTE — Progress Notes (Signed)
Established Patient Office Visit  Subjective   Patient ID: Gina Hopkins, female    DOB: 1955/08/30  Age: 67 y.o. MRN: [id number]  Chief Complaint  Patient presents with   Hypertension    HPI Caregiver stress-the last several months have been incredibly stressful.  She was finally able to get full guardianship over her sister who has advanced dementia.  So things have been settling down a little bit.  But she has lost weight and she has been smoking more.  She has some siblings who have been helpful but have not been able to do most of the legal work.  Hypertension- Pt denies chest pain, SOB, dizziness, or heart palpitations.  Taking meds as directed w/o problems.  Denies medication side effects.      ROS    Objective:     BP 110/74   Pulse 78   Wt 112 lb (50.8 kg)   SpO2 96%   BMI 20.49 kg/m    Physical Exam Vitals and nursing note reviewed.  Constitutional:      Appearance: She is well-developed.  HENT:     Head: Normocephalic and atraumatic.  Cardiovascular:     Rate and Rhythm: Normal rate and regular rhythm.     Heart sounds: Normal heart sounds.  Pulmonary:     Effort: Pulmonary effort is normal.     Breath sounds: Normal breath sounds.  Skin:    General: Skin is warm and dry.  Neurological:     Mental Status: She is alert and oriented to person, place, and time.  Psychiatric:        Behavior: Behavior normal.     No results found for any visits on 11/06/22.    The 10-year ASCVD risk score (Arnett DK, et al., 2019) is: 14.3%    Assessment & Plan:   Problem List Items Addressed This Visit       Cardiovascular and Mediastinum   HYPERTENSION, BENIGN - Primary    Well controlled. Continue current regimen. Follow up in  71mo       Relevant Orders   Lipid Panel w/reflex Direct LDL   COMPLETE METABOLIC PANEL WITH GFR   CBC     Other   Tobacco abuse    He admits that her smoking has ramped up with her stress levels over the last several  months but she is trying to cut back again.  We discussed options to help her quit including nicotine patches, Wellbutrin and Chantix.  Encouraged her to reach out if at any point she is interested in 1 of these options.      Relevant Orders   Lipid Panel w/reflex Direct LDL   COMPLETE METABOLIC PANEL WITH GFR   CBC   Hyperlipidemia   Relevant Orders   Lipid Panel w/reflex Direct LDL   COMPLETE METABOLIC PANEL WITH GFR   CBC   Adjustment insomnia    Gust options.  She does try to go to bed around the same time but usually wakes up in the middle the night and then has significant difficulty going back to sleep.  We discussed trazodone as a potential option.  It may help with sleep and slightly with mood as well      Relevant Medications   traZODone (DESYREL) 50 MG tablet   Acute reaction to stress    Gust option of therapy/counseling versus medication to help with mood and improve appetite.  She feels like things are, coming more to a close  and that is been getting better so she wants to hold off on those things.  We did discuss trying to get more calories and daily to improve her weight as she has lost about 6 pounds.      Relevant Medications   traZODone (DESYREL) 50 MG tablet   Other Visit Diagnoses     Caregiver stress       Abnormal weight loss           Return in about 3 months (around 02/06/2023) for Sleep and Mood .    Nani Gasser, MD

## 2022-11-07 LAB — CBC
HCT: 39.3 % (ref 35.0–45.0)
Hemoglobin: 13.3 g/dL (ref 11.7–15.5)
MCHC: 33.8 g/dL (ref 32.0–36.0)
MCV: 93.3 fL (ref 80.0–100.0)
MPV: 12 fL (ref 7.5–12.5)
Platelets: 249 10*3/uL (ref 140–400)
RDW: 12.6 % (ref 11.0–15.0)

## 2022-11-07 LAB — COMPLETE METABOLIC PANEL WITH GFR
AG Ratio: 1.7 (calc) (ref 1.0–2.5)
ALT: 9 U/L (ref 6–29)
AST: 17 U/L (ref 10–35)
Albumin: 4.2 g/dL (ref 3.6–5.1)
Alkaline phosphatase (APISO): 75 U/L (ref 37–153)
BUN: 13 mg/dL (ref 7–25)
CO2: 30 mmol/L (ref 20–32)
Calcium: 10.3 mg/dL (ref 8.6–10.4)
Chloride: 100 mmol/L (ref 98–110)
Creat: 0.97 mg/dL (ref 0.50–1.05)
Globulin: 2.5 g/dL (calc) (ref 1.9–3.7)
Glucose, Bld: 87 mg/dL (ref 65–99)
Potassium: 3.7 mmol/L (ref 3.5–5.3)
Sodium: 140 mmol/L (ref 135–146)
Total Bilirubin: 0.9 mg/dL (ref 0.2–1.2)
Total Protein: 6.7 g/dL (ref 6.1–8.1)
eGFR: 64 mL/min/{1.73_m2} (ref >=60)

## 2022-11-07 LAB — LIPID PANEL W/REFLEX DIRECT LDL
Cholesterol: 141 mg/dL (ref <200)
HDL: 63 mg/dL (ref >=50)
LDL Cholesterol (Calc): 64 mg/dL (calc)
Non-HDL Cholesterol (Calc): 78 mg/dL (calc) (ref <130)
Total CHOL/HDL Ratio: 2.2 (calc) (ref <5.0)
Triglycerides: 62 mg/dL (ref <150)

## 2022-11-07 NOTE — Progress Notes (Signed)
Your lab work is within acceptable range and there are no concerning findings.   ?

## 2023-01-08 ENCOUNTER — Ambulatory Visit: Payer: No Typology Code available for payment source

## 2023-01-08 ENCOUNTER — Other Ambulatory Visit: Payer: Self-pay | Admitting: Family Medicine

## 2023-01-08 DIAGNOSIS — F5102 Adjustment insomnia: Secondary | ICD-10-CM

## 2023-01-08 DIAGNOSIS — Z1231 Encounter for screening mammogram for malignant neoplasm of breast: Secondary | ICD-10-CM | POA: Diagnosis not present

## 2023-01-08 DIAGNOSIS — Z0000 Encounter for general adult medical examination without abnormal findings: Secondary | ICD-10-CM

## 2023-01-09 NOTE — Progress Notes (Signed)
Please call patient. Normal mammogram.  Repeat in 1 year.  

## 2023-01-29 ENCOUNTER — Encounter: Payer: Self-pay | Admitting: Family Medicine

## 2023-01-29 ENCOUNTER — Ambulatory Visit (INDEPENDENT_AMBULATORY_CARE_PROVIDER_SITE_OTHER): Payer: No Typology Code available for payment source | Admitting: Family Medicine

## 2023-01-29 VITALS — BP 91/60 | HR 80 | Ht 62.0 in | Wt 110.0 lb

## 2023-01-29 DIAGNOSIS — F5102 Adjustment insomnia: Secondary | ICD-10-CM | POA: Diagnosis not present

## 2023-01-29 DIAGNOSIS — J301 Allergic rhinitis due to pollen: Secondary | ICD-10-CM | POA: Diagnosis not present

## 2023-01-29 DIAGNOSIS — L723 Sebaceous cyst: Secondary | ICD-10-CM

## 2023-01-29 DIAGNOSIS — I10 Essential (primary) hypertension: Secondary | ICD-10-CM

## 2023-01-29 DIAGNOSIS — Z23 Encounter for immunization: Secondary | ICD-10-CM

## 2023-01-29 DIAGNOSIS — R634 Abnormal weight loss: Secondary | ICD-10-CM | POA: Diagnosis not present

## 2023-01-29 DIAGNOSIS — F430 Acute stress reaction: Secondary | ICD-10-CM | POA: Diagnosis not present

## 2023-01-29 DIAGNOSIS — R040 Epistaxis: Secondary | ICD-10-CM | POA: Diagnosis not present

## 2023-01-29 DIAGNOSIS — L089 Local infection of the skin and subcutaneous tissue, unspecified: Secondary | ICD-10-CM

## 2023-01-29 DIAGNOSIS — Z636 Dependent relative needing care at home: Secondary | ICD-10-CM | POA: Diagnosis not present

## 2023-01-29 MED ORDER — CEPHALEXIN 500 MG PO CAPS
500.0000 mg | ORAL_CAPSULE | Freq: Three times a day (TID) | ORAL | 0 refills | Status: DC
Start: 2023-01-29 — End: 2023-02-06

## 2023-01-29 MED ORDER — LISINOPRIL 5 MG PO TABS
5.0000 mg | ORAL_TABLET | Freq: Every day | ORAL | 0 refills | Status: DC
Start: 2023-01-29 — End: 2023-04-29

## 2023-01-29 MED ORDER — MIRTAZAPINE 7.5 MG PO TABS
7.5000 mg | ORAL_TABLET | Freq: Every day | ORAL | 1 refills | Status: DC
Start: 2023-01-29 — End: 2023-02-13

## 2023-01-29 NOTE — Addendum Note (Signed)
Addended by: Nani Gasser D on: 01/29/2023 01:51 PM   Modules accepted: Level of Service

## 2023-01-29 NOTE — Patient Instructions (Addendum)
I am decreasing your blood pressure pill since your blood pressures have dropped significantly probably because of the weight loss.  You to start the mirtazapine to help improve weight gain as well as mood.  Were just going to start with a really low dose and then add like to see you back in 6 weeks so that we can make an adjustment if it is helpful.  Tomorrow afternoon or evening please pull the string out about an inch.  You can trim it but just make sure to leave a little tail so that the next day you can pull another inch out.  Just keep it covered loosely with some gauze just to absorb any excess drainage.  Once the gauze comes completely out then you will just need to again keep covered loosely with a breathable gauze to absorb any excess drainage.  Once the incision looks like it is almost close.,  Please apply Vaseline twice a day to promote full healing.  Pick up your antibiotic and start that today please make sure to take it with food and water.  Call if any problems or concerns.

## 2023-01-29 NOTE — Assessment & Plan Note (Signed)
She was low some going to stop the HCTZ component of her pill and decrease the lisinopril component.  Follow-up in 6 weeks.

## 2023-01-29 NOTE — Progress Notes (Signed)
Acute Office Visit  Subjective:     Patient ID: Gina Hopkins, female    DOB: 07/30/1955, 67 y.o.   MRN: [id number]  Chief Complaint  Patient presents with   Recurrent Skin Infections    HPI Patient is in today for boil on right outer hip.  She has had a sebaceous cyst for years but she feels it has gradually gotten larger over the last couple of months in the last 2 days especially become very tender and sore she has not noticed any drainage.  She has a smaller lesion in her right axilla but it is not bothersome today.  She had previously lost weight when she was here as she was under a lot of stress and we discussed keeping an eye on it.  3 months later she is actually down another 2 pounds.  She says a lot of times she will just need a couple crackers she still does not really have an appetite.  No pain or fever or chills.  Normal CBC and metabolic panel back in May.  ROS      Objective:    BP 91/60   Pulse 80   Ht 5\' 2"  (1.575 m)   Wt 110 lb (49.9 kg)   SpO2 98%   BMI 20.12 kg/m     Physical Exam  No results found for any visits on 01/29/23.      Assessment & Plan:   Problem List Items Addressed This Visit       Cardiovascular and Mediastinum   HYPERTENSION, BENIGN    She was low some going to stop the HCTZ component of her pill and decrease the lisinopril component.  Follow-up in 6 weeks.      Relevant Medications   lisinopril (ZESTRIL) 5 MG tablet   Other Visit Diagnoses     Infected sebaceous cyst    -  Primary   Relevant Medications   cephALEXin (KEFLEX) 500 MG capsule   Abnormal weight loss       Relevant Medications   mirtazapine (REMERON) 7.5 MG tablet      Incision and Drainage Procedure Note  Pre-operative Diagnosis: Inflamed sebaceous cyst.  Post-operative Diagnosis: Infected sebaceous cyst.  Indications: Sected sebaceous cyst.  Anesthesia: 1% plain lidocaine  Procedure Details  The procedure, risks and complications have  been discussed in detail (including, but not limited to airway compromise, infection, bleeding) with the patient, and the patient gave verbal consent to the procedure.  The skin was sterilely prepped and draped over the affected area in the usual fashion. After adequate local anesthesia, I&D with a #11 blade was performed on the right outer hip. Purulent drainage: present The patient was observed until stable. Wound culture sent   Findings: Purulent drainage.  Cyst and cyst wall removed.  Wound probed with sterile applicator to rule out any additional pockets or tracts of infection..  Packing placed.  EBL: trace  cc's  Drains: 1/2 inch iodoform gauze   Condition: Tolerated procedure well   Complications: none.   Meds ordered this encounter  Medications   cephALEXin (KEFLEX) 500 MG capsule    Sig: Take 1 capsule (500 mg total) by mouth 3 (three) times daily.    Dispense:  21 capsule    Refill:  0   mirtazapine (REMERON) 7.5 MG tablet    Sig: Take 1 tablet (7.5 mg total) by mouth at bedtime.    Dispense:  30 tablet    Refill:  1  lisinopril (ZESTRIL) 5 MG tablet    Sig: Take 1 tablet (5 mg total) by mouth daily.    Dispense:  90 tablet    Refill:  0    Return in about 6 weeks (around 03/12/2023) for recheck BP and Mood and Weight check .  Nani Gasser, MD

## 2023-01-29 NOTE — Addendum Note (Signed)
Addended by: Deno Etienne on: 01/29/2023 12:03 PM   Modules accepted: Orders

## 2023-02-03 MED ORDER — SULFAMETHOXAZOLE-TRIMETHOPRIM 800-160 MG PO TABS
1.0000 | ORAL_TABLET | Freq: Two times a day (BID) | ORAL | 0 refills | Status: DC
Start: 2023-02-03 — End: 2023-02-06

## 2023-02-03 NOTE — Progress Notes (Signed)
HI [name],  Based on your wound culture I am going to need to change her antibiotic.  Sent over a medication called trimethoprim sulfa.  So we will just discontinue the 1 that you are taking and start the new 1 as soon as you are able to pick it up.

## 2023-02-03 NOTE — Addendum Note (Signed)
Addended by: Nani Gasser D on: 02/03/2023 03:22 PM   Modules accepted: Orders

## 2023-02-06 ENCOUNTER — Ambulatory Visit (INDEPENDENT_AMBULATORY_CARE_PROVIDER_SITE_OTHER): Payer: No Typology Code available for payment source | Admitting: Family Medicine

## 2023-02-06 DIAGNOSIS — I10 Essential (primary) hypertension: Secondary | ICD-10-CM

## 2023-02-06 DIAGNOSIS — F430 Acute stress reaction: Secondary | ICD-10-CM

## 2023-02-06 NOTE — Progress Notes (Deleted)
   Established Patient Office Visit  Subjective   Patient ID: Gina Hopkins, female    DOB: 07/11/1955  Age: 67 y.o. MRN: [id number]  No chief complaint on file.   HPI Hypertension- Pt denies chest pain, SOB, dizziness, or heart palpitations.  Taking meds as directed w/o problems.  Denies medication side effects.    F/U stress reaction -    {History (Optional):23778}  ROS    Objective:     There were no vitals taken for this visit. {Vitals History (Optional):23777}  Physical Exam   No results found for any visits on 02/06/23.  {Labs (Optional):23779}  The 10-year ASCVD risk score (Arnett DK, et al., 2019) is: 6.2%    Assessment & Plan:   Problem List Items Addressed This Visit       Cardiovascular and Mediastinum   HYPERTENSION, BENIGN - Primary     Other   Acute reaction to stress    No follow-ups on file.    Nani Gasser, MD

## 2023-02-07 NOTE — Progress Notes (Signed)
Car trouble

## 2023-02-13 ENCOUNTER — Encounter: Payer: Self-pay | Admitting: Family Medicine

## 2023-02-13 ENCOUNTER — Ambulatory Visit (INDEPENDENT_AMBULATORY_CARE_PROVIDER_SITE_OTHER): Payer: No Typology Code available for payment source | Admitting: Family Medicine

## 2023-02-13 VITALS — BP 102/57 | HR 66 | Ht 62.0 in | Wt 115.0 lb

## 2023-02-13 DIAGNOSIS — Z636 Dependent relative needing care at home: Secondary | ICD-10-CM

## 2023-02-13 DIAGNOSIS — F5102 Adjustment insomnia: Secondary | ICD-10-CM

## 2023-02-13 DIAGNOSIS — J301 Allergic rhinitis due to pollen: Secondary | ICD-10-CM

## 2023-02-13 DIAGNOSIS — R634 Abnormal weight loss: Secondary | ICD-10-CM

## 2023-02-13 DIAGNOSIS — I10 Essential (primary) hypertension: Secondary | ICD-10-CM | POA: Diagnosis not present

## 2023-02-13 DIAGNOSIS — F430 Acute stress reaction: Secondary | ICD-10-CM | POA: Diagnosis not present

## 2023-02-13 DIAGNOSIS — R040 Epistaxis: Secondary | ICD-10-CM | POA: Diagnosis not present

## 2023-02-13 MED ORDER — FLONASE SENSIMIST 27.5 MCG/SPRAY NA SUSP: NASAL | 1 refills | Status: AC

## 2023-02-13 MED ORDER — MIRTAZAPINE 7.5 MG PO TABS
7.5000 mg | ORAL_TABLET | Freq: Every day | ORAL | 1 refills | Status: DC
Start: 2023-02-13 — End: 2023-04-24

## 2023-02-13 NOTE — Assessment & Plan Note (Signed)
Pressure looks absolutely fantastic today.

## 2023-02-13 NOTE — Progress Notes (Signed)
Established Patient Office Visit  Subjective   Patient ID: Gina Hopkins, female    DOB: 16-Jan-1956  Age: 67 y.o. 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  She was having significant sleep disruption waking up in the middle the night not being able to fall back asleep so we decided to try the trazodone.  She says she started out by taking a half a tab and noticed it did help some but she was still waking up in the middle the night having difficulty going back to sleep but she was getting a little bit more sleep.  When she tried taking a whole tab she felt like she slept great the whole night but then would feel kind of groggy and most like she needed to lay back down and take a nap in the morning.  Says she is been mostly sticking with a half a tab.  But she feels like the mirtazapine has actually done the most benefit she actually feels like that helps her rest a little bit better and she wakes up feeling hungry and wanting to eat.  In fact her weight is up about 5 pounds.  She feels that she is doing a lot better with trying to get some snacks in and get some calories then.  She still walking her 2 miles a day but not as much as she was previously.  So wanted to let me know that 1 night she woke up with a nosebleed she has never had that happen before she has been using Flonase because she is been having a lot of sinus pressure over the last several weeks.  Was also here recently for an inflamed/infected sebaceous cyst.     ROS    Objective:     BP (!) 102/57   Pulse 66   Ht 5\' 2"  (1.575 m)   Wt 115 lb (52.2 kg)   SpO2 100%   BMI 21.03 kg/m    Physical Exam Vitals and nursing note reviewed.  Constitutional:      Appearance: Normal appearance.  HENT:     Head: Normocephalic and atraumatic.  Eyes:     Conjunctiva/sclera:  Conjunctivae normal.  Cardiovascular:     Rate and Rhythm: Normal rate and regular rhythm.  Pulmonary:     Effort: Pulmonary effort is normal.     Breath sounds: Normal breath sounds.  Skin:    General: Skin is warm and dry.  Neurological:     Mental Status: She is alert.  Psychiatric:        Mood and Affect: Mood normal.      No results found for any visits on 02/13/23.    The 10-year ASCVD risk score (Arnett DK, et al., 2019) is: 8%    Assessment & Plan:   Problem List Items Addressed This Visit       Cardiovascular and Mediastinum   HYPERTENSION, BENIGN    Pressure looks absolutely fantastic today.        Respiratory   Allergic rhinitis   Relevant Medications   fluticasone (FLONASE SENSIMIST) 27.5 MCG/SPRAY nasal spray     Other   Adjustment insomnia - Primary    Discussed discontinuing the trazodone since she actually feels like the Remeron has been more helpful.  She is gena keep the trazodone soap  once in a while she needs to take a whole tab to sleep well she can so we will switch to may be for as needed use.      Acute reaction to stress    Great with the murmur on an appetite is back up which is great we will stick with 7.5 mg and plan to follow-up after the holidays.  If at that point she still doing well we might consider tapering off but we will see how things are going.      Relevant Medications   mirtazapine (REMERON) 7.5 MG tablet   Other Visit Diagnoses     Caregiver stress       Abnormal weight loss       Relevant Medications   mirtazapine (REMERON) 7.5 MG tablet   Nosebleed          This bleed-we did discuss that it could be a side effect of the Flonase and if that happens she will need to take a break from the medication for at least a week to let her nose heal.  She often gets burning when she uses the Flonase so we also discussed switching to the since the mist which might be a better option or either Nasonex.  Return in about 5  months (around 07/07/2023), or Mood and Stress.    Nani Gasser, MD

## 2023-02-13 NOTE — Assessment & Plan Note (Signed)
Great with the murmur on an appetite is back up which is great we will stick with 7.5 mg and plan to follow-up after the holidays.  If at that point she still doing well we might consider tapering off but we will see how things are going.

## 2023-02-13 NOTE — Assessment & Plan Note (Signed)
Discussed discontinuing the trazodone since she actually feels like the Remeron has been more helpful.  She is gena keep the trazodone soap once in a while she needs to take a whole tab to sleep well she can so we will switch to may be for as needed use.

## 2023-02-18 DIAGNOSIS — Z23 Encounter for immunization: Secondary | ICD-10-CM | POA: Diagnosis not present

## 2023-03-12 ENCOUNTER — Ambulatory Visit: Payer: No Typology Code available for payment source | Admitting: Family Medicine

## 2023-04-23 ENCOUNTER — Other Ambulatory Visit: Payer: Self-pay | Admitting: Family Medicine

## 2023-04-23 DIAGNOSIS — R634 Abnormal weight loss: Secondary | ICD-10-CM

## 2023-04-26 ENCOUNTER — Other Ambulatory Visit: Payer: Self-pay | Admitting: Family Medicine

## 2023-05-01 ENCOUNTER — Other Ambulatory Visit: Payer: Self-pay | Admitting: Family Medicine

## 2023-05-01 DIAGNOSIS — E785 Hyperlipidemia, unspecified: Secondary | ICD-10-CM

## 2023-05-09 DIAGNOSIS — H524 Presbyopia: Secondary | ICD-10-CM | POA: Diagnosis not present

## 2023-05-09 DIAGNOSIS — Z0100 Encounter for examination of eyes and vision without abnormal findings: Secondary | ICD-10-CM | POA: Diagnosis not present

## 2023-06-11 ENCOUNTER — Other Ambulatory Visit: Payer: Self-pay | Admitting: Family Medicine

## 2023-06-11 DIAGNOSIS — J301 Allergic rhinitis due to pollen: Secondary | ICD-10-CM

## 2023-07-16 ENCOUNTER — Ambulatory Visit (INDEPENDENT_AMBULATORY_CARE_PROVIDER_SITE_OTHER): Payer: No Typology Code available for payment source | Admitting: Family Medicine

## 2023-07-16 ENCOUNTER — Encounter: Payer: Self-pay | Admitting: Family Medicine

## 2023-07-16 VITALS — BP 149/76 | HR 69 | Ht 62.0 in | Wt 130.2 lb

## 2023-07-16 DIAGNOSIS — R6881 Early satiety: Secondary | ICD-10-CM | POA: Diagnosis not present

## 2023-07-16 DIAGNOSIS — E785 Hyperlipidemia, unspecified: Secondary | ICD-10-CM

## 2023-07-16 DIAGNOSIS — F430 Acute stress reaction: Secondary | ICD-10-CM

## 2023-07-16 DIAGNOSIS — I10 Essential (primary) hypertension: Secondary | ICD-10-CM | POA: Diagnosis not present

## 2023-07-16 DIAGNOSIS — F5102 Adjustment insomnia: Secondary | ICD-10-CM

## 2023-07-16 MED ORDER — LISINOPRIL 5 MG PO TABS
5.0000 mg | ORAL_TABLET | Freq: Every day | ORAL | 1 refills | Status: DC
Start: 2023-07-16 — End: 2023-10-03

## 2023-07-16 MED ORDER — ZOLPIDEM TARTRATE 5 MG PO TABS: 2.5000 mg | ORAL_TABLET | Freq: Every evening | ORAL | 1 refills | Status: AC | PRN

## 2023-07-16 NOTE — Assessment & Plan Note (Addendum)
She says she typically will go to bed around 12-12 30.  Usually falls asleep by around 1 to 1:30 AM.  Within usually an hours around 2 to 2:30 AM maybe 3:00 she will wake back up and then often times it is 5 AM before she is actually able to fall back asleep.  And then sometimes will sleep until 9.  She says it is not every night but it is most nights.  Will discontinue the Remeron since she still having frequent nighttime awakenings and we will going to switch to Ambien. Looks like Nurse, mental health

## 2023-07-16 NOTE — Progress Notes (Signed)
Established Patient Office Visit  Subjective  Patient ID: Gina Hopkins, female    DOB: Sep 07, 1955  Age: 68 y.o. MRN: [id number]  Chief Complaint  Patient presents with   5 mth follow up    Mood/ stress   Insomnia    Insomnia follow up - pat states remeron helps to fall asleep  but will not stay asleep     HPI  Hypertension- Pt denies chest pain, SOB, dizziness, or heart palpitations.  Taking meds as directed w/o problems.  Denies medication side effects.    Hyperlipidemia - tolerating stating well with no myalgias or significant side effects.  Lab Results  Component Value Date   CHOL 141 11/06/2022   HDL 63 11/06/2022   LDLCALC 64 11/06/2022   TRIG 62 11/06/2022   CHOLHDL 2.2 11/06/2022     F/U Mood/Stress - not currently on medication.  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  She had also been not eating well because of stress levels and we put her on Remeron in part to help with sleep and also to improve appetite.  She is actually up about 15 pounds.    Insomnia follow up - pat states remeron helps to fall asleep  but will not stay asleep.previously on trazodone but made her groggy. .         ROS    Objective:     BP (!) 149/76   Pulse 69   Ht 5\' 2"  (1.575 m)   Wt 130 lb 4 oz (59.1 kg)   SpO2 100%   BMI 23.82 kg/m    Physical Exam Vitals and nursing note reviewed.  Constitutional:      Appearance: Normal appearance.  HENT:     Head: Normocephalic and atraumatic.  Eyes:     Conjunctiva/sclera: Conjunctivae normal.  Cardiovascular:     Rate and Rhythm: Normal rate and regular rhythm.  Pulmonary:     Effort: Pulmonary effort is normal.     Breath sounds: Normal breath sounds.  Skin:    General: Skin is warm and dry.  Neurological:     Mental Status: She is alert.  Psychiatric:        Mood and Affect: Mood normal.      No results found for any visits on 07/16/23.    The 10-year ASCVD risk score (Arnett DK, et al., 2019) is: 19.3%     Assessment & Plan:   Problem List Items Addressed This Visit       Cardiovascular and Mediastinum   HYPERTENSION, BENIGN   BP high today but great at last OV> Recheck in 2 weeks w/ nurse visit.       Relevant Medications   lisinopril (ZESTRIL) 5 MG tablet   Other Relevant Orders   CMP14+EGFR     Other   Hyperlipidemia   Relevant Medications   lisinopril (ZESTRIL) 5 MG tablet   Other Relevant Orders   CMP14+EGFR   Early satiety   She has done great with the Remeron and is up 15 pounds .  taper it off at this point.  I think she is doing better and some of her stressors have settled down a little bit which is great.      Adjustment insomnia - Primary   She says she typically will go to bed around 12-12 30.  Usually falls asleep by around 1 to 1:30 AM.  Within usually an hours around 2 to 2:30 AM maybe 3:00 she will wake back  up and then often times it is 5 AM before she is actually able to fall back asleep.  And then sometimes will sleep until 9.  She says it is not every night but it is most nights.  Will discontinue the Remeron since she still having frequent nighttime awakenings and we will going to switch to Ambien. Looks like Nurse, mental health      Relevant Medications   zolpidem (AMBIEN) 5 MG tablet   Acute reaction to stress   Really well right now.  Though one of her sisters recently was diagnosed with bilateral PEs.       Encouraged to schedule bone density test.   No follow-ups on file.    Nani Gasser, MD

## 2023-07-16 NOTE — Assessment & Plan Note (Signed)
Really well right now.  Though one of her sisters recently was diagnosed with bilateral PEs.

## 2023-07-16 NOTE — Assessment & Plan Note (Signed)
She has done great with the Remeron and is up 15 pounds .  taper it off at this point.  I think she is doing better and some of her stressors have settled down a little bit which is great.

## 2023-07-16 NOTE — Assessment & Plan Note (Signed)
BP high today but great at last OV> Recheck in 2 weeks w/ nurse visit.

## 2023-07-17 ENCOUNTER — Encounter: Payer: Self-pay | Admitting: Family Medicine

## 2023-07-17 LAB — CMP14+EGFR
ALT: 14 [IU]/L (ref 0–32)
AST: 21 [IU]/L (ref 0–40)
Albumin: 4.3 g/dL (ref 3.9–4.9)
Alkaline Phosphatase: 123 [IU]/L — ABNORMAL HIGH (ref 44–121)
BUN/Creatinine Ratio: 13 (ref 12–28)
BUN: 13 mg/dL (ref 8–27)
Bilirubin Total: 0.7 mg/dL (ref 0.0–1.2)
CO2: 25 mmol/L (ref 20–29)
Calcium: 9.9 mg/dL (ref 8.7–10.3)
Chloride: 105 mmol/L (ref 96–106)
Creatinine, Ser: 1.02 mg/dL — ABNORMAL HIGH (ref 0.57–1.00)
Globulin, Total: 2.3 g/dL (ref 1.5–4.5)
Glucose: 92 mg/dL (ref 70–99)
Potassium: 4.3 mmol/L (ref 3.5–5.2)
Sodium: 144 mmol/L (ref 134–144)
Total Protein: 6.6 g/dL (ref 6.0–8.5)
eGFR: 60 mL/min/{1.73_m2} (ref >59)

## 2023-07-17 NOTE — Progress Notes (Signed)
Your lab work is within acceptable range and there are no concerning findings.   ?

## 2023-07-30 ENCOUNTER — Ambulatory Visit (INDEPENDENT_AMBULATORY_CARE_PROVIDER_SITE_OTHER): Payer: No Typology Code available for payment source

## 2023-07-30 VITALS — BP 113/71 | HR 68 | Ht 62.0 in

## 2023-07-30 DIAGNOSIS — I10 Essential (primary) hypertension: Secondary | ICD-10-CM | POA: Diagnosis not present

## 2023-07-30 NOTE — Progress Notes (Signed)
   Established Patient Office Visit  Subjective   Patient ID: Gina Hopkins, female    DOB: [birth year]  Age: 68 y.o. MRN: [id number]  Chief Complaint  Patient presents with   Hypertension    BP check nurse visit    HPI  Hypertension, BP check nurse visit. Patient was questioning about flonase  and lesser strength flonase  sensismist 27.5mg , spoke with pharmacy and the sensimist is OTC and would run around $30- patient requesting to continue with flonase  50mcg  since she can not afford the OTC cost. Pharmacy informed.   ROS    Objective:     BP 133/77   Pulse 68   Ht 5' 2 (1.575 m)   SpO2 100%   BMI 23.82 kg/m    Physical Exam   No results found for any visits on 07/30/23.    The 10-year ASCVD risk score (Arnett DK, et al., 2019) is: 15.3%    Assessment & Plan:  BP check initial reading = 133/77. Second reading =113/71 Problem List Items Addressed This Visit   None   No follow-ups on file.    Suzen SHAUNNA Plenty, LPN

## 2023-07-30 NOTE — Patient Instructions (Signed)
 Return in 6 months for HTN follow up with Dr. Leonides Ramp.

## 2023-08-25 ENCOUNTER — Ambulatory Visit: Payer: No Typology Code available for payment source

## 2023-08-25 VITALS — Ht 62.0 in | Wt 123.0 lb

## 2023-08-25 DIAGNOSIS — Z0000 Encounter for general adult medical examination without abnormal findings: Secondary | ICD-10-CM | POA: Diagnosis not present

## 2023-08-25 DIAGNOSIS — Z780 Asymptomatic menopausal state: Secondary | ICD-10-CM | POA: Diagnosis not present

## 2023-08-25 DIAGNOSIS — Z13820 Encounter for screening for osteoporosis: Secondary | ICD-10-CM

## 2023-08-25 NOTE — Patient Instructions (Signed)
  Gina Hopkins , Thank you for taking time to come for your Medicare Wellness Visit. I appreciate your ongoing commitment to your health goals. Please review the following plan we discussed and let me know if I can assist you in the future.   These are the goals we discussed:  Goals       Activity and Exercise Increased      She would like to increase exercise.       Exercise 150 min/wk Moderate Activity      Patient Stated (pt-stated)      08/23/2022 AWV Goal: Tobacco Cessation  Smoking cessation instruction/counseling given:  counseled patient on the dangers of tobacco use, advised patient to stop smoking, and reviewed strategies to maximize success  Patient will verbalize understanding of the health risks associated with smoking/tobacco use Lung cancer or lung disease, such as COPD Heart disease. Stroke. Heart attack Infertility Osteoporosis and bone fractures. Patient will create a plan to quit smoking/using tobacco Pick a date to quit.  Write down the reasons why you are quitting and put it where you will see it often. Identify the people, places, things, and activities that make you want to smoke (triggers) and avoid them. Make sure to take these actions: Throw away all cigarettes at home, at work, and in your car. Throw away smoking accessories, such as Set designer. Clean your car and make sure to empty the ashtray. Clean your home, including curtains and carpets. Tell your family, friends, and coworkers that you are quitting. Support from your loved ones can make quitting easier. Talk with your health care provider about your options for quitting smoking. Find out what treatment options are covered by your health insurance. Patient will be able to demonstrate knowledge of tobacco cessation strategies that may maximize success Quitting "cold [place]" is more successful than gradually quitting. Attending in-person counseling to help you build problem-solving skills.   Finding resources and support systems that can help you to quit smoking and remain smoke-free after you quit. These resources are most helpful when you use them often. They can include: Online chats with a Veterinary surgeon. Telephone quitlines. Printed Materials engineer. Support groups or group counseling. Text messaging programs. Mobile phone applications. Taking medicines to help you quit smoking: Nicotine patches, gum, or lozenges. Nicotine inhalers or sprays. Non-nicotine medicine that is taken by mouth. Patient will note get discouraged if the process is difficult Over the next year, patient will stop smoking or using other forms of tobacco  Smoking cessation instruction/counseling given:  counseled patient on the dangers of tobacco use, advised patient to stop smoking, and reviewed strategies to maximize success        Quit Smoking        This is a list of the screening recommended for you and due dates:  Health Maintenance  Topic Date Due   Zoster (Shingles) Vaccine (2 of 2) 03/04/2022   Medicare Annual Wellness Visit  08/24/2024   Mammogram  01/07/2025   Colon Cancer Screening  10/18/2027   DTaP/Tdap/Td vaccine (3 - Td or Tdap) 10/11/2029   Pneumonia Vaccine  Completed   Flu Shot  Completed   DEXA scan (bone density measurement)  Completed   COVID-19 Vaccine  Completed   Hepatitis C Screening  Completed   HPV Vaccine  Aged Out

## 2023-08-25 NOTE — Progress Notes (Signed)
 Subjective:   Rolanda Campa Spickard is a 68 y.o. female who presents for Medicare Annual (Subsequent) preventive examination.  Visit Complete: Virtual I connected with  Lexani Corona Delahoz on 08/25/23 by a audio enabled telemedicine application and verified that I am speaking with the correct person using two identifiers.  Patient Location: Home  Provider Location: Office/Clinic  I discussed the limitations of evaluation and management by telemedicine. The patient expressed understanding and agreed to proceed.  Vital Signs: Because this visit was a virtual/telehealth visit, some criteria may be missing or patient reported. Any vitals not documented were not able to be obtained and vitals that have been documented are patient reported.  Patient Medicare AWV questionnaire was completed by the patient on 08/23/2023; I have confirmed that all information answered by patient is correct and no changes since this date.  Cardiac Risk Factors include: advanced age (>8men, >65 women);hypertension;smoking/ tobacco exposure;dyslipidemia;family history of premature cardiovascular disease     Objective:    Today's Vitals   08/25/23 0940  Weight: 123 lb (55.8 kg)  Height: 5\' 2"  (1.575 m)   Body mass index is 22.5 kg/m.     08/25/2023    9:47 AM 08/23/2022    8:59 AM  Advanced Directives  Does Patient Have a Medical Advance Directive? No No  Would patient like information on creating a medical advance directive? Yes (MAU/Ambulatory/Procedural Areas - Information given) No - Patient declined    Current Medications (verified) Outpatient Encounter Medications as of 08/25/2023  Medication Sig   atorvastatin (LIPITOR) 40 MG tablet TAKE 1 TABLET BY MOUTH EVERY DAY   fluticasone (FLONASE SENSIMIST) 27.5 MCG/SPRAY nasal spray 1 spray into each nostril daily.   fluticasone (FLONASE) 50 MCG/ACT nasal spray PLACE 1-2 SPRAYS INTO BOTH NOSTRILS DAILY.   lisinopril (ZESTRIL) 5 MG tablet Take 1 tablet (5 mg total)  by mouth daily.   Polyethyl Glycol-Propyl Glycol (SYSTANE ULTRA PF OP) Apply to eye 4 (four) times daily.   zolpidem (AMBIEN) 5 MG tablet Take 0.5-1 tablets (2.5-5 mg total) by mouth at bedtime as needed for sleep.   No facility-administered encounter medications on file as of 08/25/2023.    Allergies (verified) Hydrochlorothiazide and Propoxyphene n-acetaminophen   History: Past Medical History:  Diagnosis Date   Allergy    Seasonal   History of colon polyps    Hypertension    Seasonal allergies    Sinus infection    Past Surgical History:  Procedure Laterality Date   ABDOMINAL HYSTERECTOMY  2006   and one ovary   COLONOSCOPY  02/11/2017   ESOPHAGOGASTRODUODENOSCOPY     Years ago per patient    SMALL INTESTINE SURGERY  10/17/2020   Endoscopy   TOTAL ABDOMINAL HYSTERECTOMY  2006   TUBAL LIGATION     Family History  Problem Relation Age of Onset   Hypertension Mother    Stroke Mother    Heart attack Father    Hypertension Sister    Breast cancer Sister 68   Breast cancer Sister 65   Cancer Sister    Hypertension Sister    Hypertension Sister    Hypertension Sister    Hypertension Sister    Hypertension Sister    Hypertension Brother    Hypertension Brother    Lung cancer Nephew 58   Hypertension Other    Colon cancer Neg Hx    Esophageal cancer Neg Hx    Social History   Socioeconomic History   Marital status: Married  Spouse name: Leonette Most Manganiello   Number of children: 2   Years of education: 12   Highest education level: 12th grade  Occupational History   Occupation: Retired    Comment: Academic librarian [employer]   Occupation: Retired  Tobacco Use   Smoking status: Every Day    Current packs/day: 0.25    Average packs/day: 0.3 packs/day for 10.0 years (2.5 ttl pk-yrs)    Types: Cigarettes   Smokeless tobacco: Never   Tobacco comments:    Hope to change  this year  Vaping Use   Vaping status: Never Used  Substance and Sexual Activity   Alcohol  use: No   Drug use: No   Sexual activity: Not Currently  Other Topics Concern   Not on file  Social History Narrative   Lives with her spouse. She has two children. She has been stressed lately as she has become a legal guardian for her sister who was diagnosed with Dementia. She enjoys watching sports, going to comedy shows, spending time with her grandchild and playing bingo.    Social Drivers of Corporate investment banker Strain: Low Risk  (08/25/2023)   Overall Financial Resource Strain (CARDIA)    Difficulty of Paying Living Expenses: Not very hard  Food Insecurity: No Food Insecurity (08/25/2023)   Hunger Vital Sign    Worried About Running Out of Food in the Last Year: Never true    Ran Out of Food in the Last Year: Never true  Transportation Needs: No Transportation Needs (08/25/2023)   PRAPARE - Administrator, Civil Service (Medical): No    Lack of Transportation (Non-Medical): No  Physical Activity: Insufficiently Active (08/25/2023)   Exercise Vital Sign    Days of Exercise per Week: 4 days    Minutes of Exercise per Session: 30 min  Stress: No Stress Concern Present (08/25/2023)   [employer] of Occupational Health - Occupational Stress Questionnaire    Feeling of Stress : Only a little  Recent Concern: Stress - Stress Concern Present (07/15/2023)   [employer] of Occupational Health - Occupational Stress Questionnaire    Feeling of Stress : To some extent  Social Connections: Moderately Integrated (08/25/2023)   Social Connection and Isolation Panel [NHANES]    Frequency of Communication with Friends and Family: More than three times a week    Frequency of Social Gatherings with Friends and Family: Once a week    Attends Religious Services: More than 4 times per year    Active Member of [employer] or Organizations: No    Attends Engineer, structural: Never    Marital Status: Married    Tobacco Counseling Ready to quit: Not Answered Counseling  given: Not Answered Tobacco comments: Hope to change  this year   Clinical Intake:  Pre-visit preparation completed: Yes  Pain : No/denies pain     BMI - recorded: 22.5 Nutritional Status: BMI of 19-24  Normal Nutritional Risks: None Diabetes: No  How often do you need to have someone help you when you read instructions, pamphlets, or other written materials from your doctor or pharmacy?: 1 - Never What is the last grade level you completed in school?: 12  Interpreter Needed?: No      Activities of Daily Living    08/25/2023    9:41 AM 08/22/2023   10:52 AM  In your present state of health, do you have any difficulty performing the following activities:  Hearing? 0 0  Vision? 0  0  Difficulty concentrating or making decisions? 0 0  Walking or climbing stairs? 0 0  Dressing or bathing? 0 0  Doing errands, shopping? 0 0  Preparing Food and eating ? N N  Using the Toilet? N N  In the past six months, have you accidently leaked urine? N N  Do you have problems with loss of bowel control? N N  Managing your Medications? N N  Managing your Finances? N N  Housekeeping or managing your Housekeeping? N N    Patient Care Team: Agapito Games, MD as PCP - Hassel Neth, MD as Referring Physician (Ophthalmology)  Indicate any recent Medical Services you may have received from other than [facility] providers in the past year (date may be approximate).     Assessment:   This is a routine wellness examination for Raney.  Hearing/Vision screen No results found.   Goals Addressed             This Visit's Progress    Activity and Exercise Increased       She would like to increase exercise.       Depression Screen    08/25/2023    9:46 AM 07/16/2023   10:39 AM 02/13/2023    2:30 PM 08/23/2022    8:59 AM 10/23/2021    9:59 AM 08/14/2021    5:52 PM 04/24/2021    9:49 AM  PHQ 2/9 Scores  PHQ - 2 Score 0 0 0 0 0 0 0  PHQ- 9 Score   3  0      Fall  Risk    08/25/2023    9:47 AM 08/22/2023   10:52 AM 07/16/2023   10:39 AM 02/13/2023    2:29 PM 08/23/2022    8:59 AM  Fall Risk   Falls in the past year? 0 0 0 0 0  Number falls in past yr: 0 0 0 0 0  Injury with Fall? 0 0 0 0 0  Risk for fall due to : No Fall Risks  No Fall Risks No Fall Risks No Fall Risks  Follow up Falls evaluation completed  Falls evaluation completed Falls evaluation completed Falls evaluation completed    MEDICARE RISK AT HOME: Medicare Risk at Home Any stairs in or around the home?: Yes If so, are there any without handrails?: No Home free of loose throw rugs in walkways, pet beds, electrical cords, etc?: Yes Adequate lighting in your home to reduce risk of falls?: Yes Life alert?: No Use of a cane, walker or w/c?: No Grab bars in the bathroom?: No Shower chair or bench in shower?: No Elevated toilet seat or a handicapped toilet?: No  TIMED UP AND GO:  Was the test performed?  No    Cognitive Function:        08/25/2023    9:48 AM 08/23/2022    9:06 AM 08/14/2021    3:46 PM  6CIT Screen  What Year? 0 points 0 points 0 points  What month? 0 points 0 points 0 points  What time? 0 points 0 points 0 points  Count back from 20 0 points 0 points 0 points  Months in reverse 0 points 0 points 0 points  Repeat phrase 2 points 0 points 0 points  Total Score 2 points 0 points 0 points    Immunizations Immunization History  Administered Date(s) Administered   Fluad Quad(high Dose 65+) 04/24/2021, 03/28/2022   Fluad Trivalent(High Dose 65+) 02/18/2023   Influenza  Whole 04/24/2009   Influenza,inj,Quad PF,6+ Mos 06/05/2018, 03/18/2019, 04/02/2020   Janssen (J&J) SARS-COV-2 Vaccination 09/05/2019   Moderna Covid-19 Fall Seasonal Vaccine 86yrs & older 04/03/2023   Moderna Sars-Covid-2 Vaccination 05/29/2020, 01/22/2021   PNEUMOCOCCAL CONJUGATE-20 05/08/2021   Td 07/05/2009   Tdap 10/12/2019   Unspecified SARS-COV-2 Vaccination 03/28/2022   Zoster  Recombinant(Shingrix) 01/07/2022    TDAP status: Up to date  Flu Vaccine status: Up to date  Pneumococcal vaccine status: Up to date  Covid-19 vaccine status: Completed vaccines  Qualifies for Shingles Vaccine? Yes   Zostavax completed No   Shingrix Completed?: No.    Education has been provided regarding the importance of this vaccine. Patient has been advised to call insurance company to determine out of pocket expense if they have not yet received this vaccine. Advised may also receive vaccine at local pharmacy or Health Dept. Verbalized acceptance and understanding.  Screening Tests Health Maintenance  Topic Date Due   Zoster Vaccines- Shingrix (2 of 2) 03/04/2022   Medicare Annual Wellness (AWV)  08/24/2024   MAMMOGRAM  01/07/2025   Colonoscopy  10/18/2027   DTaP/Tdap/Td (3 - Td or Tdap) 10/11/2029   Pneumonia Vaccine 60+ Years old  Completed   INFLUENZA VACCINE  Completed   DEXA SCAN  Completed   COVID-19 Vaccine  Completed   Hepatitis C Screening  Completed   HPV VACCINES  Aged Out    Health Maintenance  Health Maintenance Due  Topic Date Due   Zoster Vaccines- Shingrix (2 of 2) 03/04/2022    Colorectal cancer screening: Type of screening: Colonoscopy. Completed 10/17/2020. Repeat every 7 years  Mammogram status: Completed 01/08/2023. Repeat every year  Bone Density status: Ordered 08/25/2023. Pt provided with contact info and advised to call to schedule appt.  Lung Cancer Screening: (Low Dose CT Chest recommended if Age 15-80 years, 20 pack-year currently smoking OR have quit w/in 15years.) does not qualify.   Lung Cancer Screening Referral: n/a  Additional Screening:  Hepatitis C Screening: does qualify; Completed 02/13/2016  Vision Screening: Recommended annual ophthalmology exams for early detection of glaucoma and other disorders of the eye. Is the patient up to date with their annual eye exam?  Yes  Who is the provider or what is the name of the office in  which the patient attends annual eye exams? [facility] If pt is not established with a provider, would they like to be referred to a provider to establish care?  N/a .   Dental Screening: Recommended annual dental exams for proper oral hygiene    Community Resource Referral / Chronic Care Management: CRR required this visit?  No   CCM required this visit?  No     Plan:     I have personally reviewed and noted the following in the patient's chart:   Medical and social history Use of alcohol, tobacco or illicit drugs  Current medications and supplements including opioid prescriptions. Patient is not currently taking opioid prescriptions. Functional ability and status Nutritional status Physical activity Advanced directives List of other physicians Hospitalizations, surgeries, and ER visits in previous 12 months Vitals Screenings to include cognitive, depression, and falls Referrals and appointments  In addition, I have reviewed and discussed with patient certain preventive protocols, quality metrics, and best practice recommendations. A written personalized care plan for preventive services as well as general preventive health recommendations were provided to patient.     Esmond Harps, [place]   08/25/2023   After Visit Summary: (MyChart)  Due to this being a telephonic visit, the after visit summary with patients personalized plan was offered to patient via MyChart   Nurse Notes:     Ifrah Vest Dipinto is a 68 y.o. female patient of Metheney, Barbarann Ehlers, MD who had a Medicare Annual Wellness Visit today via telephone. Fahima is Retired and lives with their spouse. she has 2 children. She reports that she is socially active and does interact with friends/family regularly. She is moderately physically active and enjoys watching sports, going to comedy shows, spending time with her grandchild and playing bingo.   Ordered bone density today.  Recommended 2nd  shingles vaccine at the local pharmacy.

## 2023-09-17 ENCOUNTER — Encounter: Payer: Self-pay | Admitting: Family Medicine

## 2023-09-17 ENCOUNTER — Ambulatory Visit

## 2023-09-17 DIAGNOSIS — Z13820 Encounter for screening for osteoporosis: Secondary | ICD-10-CM

## 2023-09-17 DIAGNOSIS — E8941 Symptomatic postprocedural ovarian failure: Secondary | ICD-10-CM | POA: Diagnosis not present

## 2023-09-17 DIAGNOSIS — M8589 Other specified disorders of bone density and structure, multiple sites: Secondary | ICD-10-CM | POA: Diagnosis not present

## 2023-09-17 DIAGNOSIS — M8580 Other specified disorders of bone density and structure, unspecified site: Secondary | ICD-10-CM | POA: Insufficient documentation

## 2023-09-17 DIAGNOSIS — Z780 Asymptomatic menopausal state: Secondary | ICD-10-CM

## 2023-09-17 NOTE — Progress Notes (Signed)
 Gina Hopkins, bone density shows a T-score of -1.3 this puts you into the mildly thin category called osteopenia.   The current recommendation for osteopenia (mildly thin bones) treatment includes:   #1 calcium-total of 1200 mg of calcium daily.  If you eat a very calcium rich diet you may be able to obtain that without a supplement.  If not, then I recommend calcium 500 mg twice a day.  There are several products over-the-counter such as Caltrate D and Viactiv chews which are great options that contain calcium and vitamin D. #2 vitamin D-recommend 800 international units daily. #3 exercise-recommend 30 minutes of weightbearing exercise 3 days a week.  Resistance training ,such as doing bands and light weights, can be particularly helpful.

## 2023-10-03 ENCOUNTER — Other Ambulatory Visit: Payer: Self-pay | Admitting: *Deleted

## 2023-10-03 DIAGNOSIS — I10 Essential (primary) hypertension: Secondary | ICD-10-CM

## 2023-10-03 MED ORDER — LISINOPRIL 5 MG PO TABS: 5.0000 mg | ORAL_TABLET | Freq: Every day | ORAL | 1 refills | Status: AC

## 2024-02-11 ENCOUNTER — Other Ambulatory Visit: Payer: Self-pay | Admitting: Family Medicine

## 2024-02-11 DIAGNOSIS — Z1231 Encounter for screening mammogram for malignant neoplasm of breast: Secondary | ICD-10-CM

## 2024-02-12 ENCOUNTER — Ambulatory Visit (INDEPENDENT_AMBULATORY_CARE_PROVIDER_SITE_OTHER)

## 2024-02-12 DIAGNOSIS — Z1231 Encounter for screening mammogram for malignant neoplasm of breast: Secondary | ICD-10-CM | POA: Diagnosis not present

## 2024-02-16 ENCOUNTER — Ambulatory Visit: Payer: Self-pay | Admitting: Family Medicine

## 2024-02-16 NOTE — Progress Notes (Signed)
 HI Gina Hopkins, the imaging dep will contact you to do an additional US  of your right armpit.

## 2024-02-17 ENCOUNTER — Other Ambulatory Visit: Payer: Self-pay | Admitting: Family Medicine

## 2024-02-17 DIAGNOSIS — R928 Other abnormal and inconclusive findings on diagnostic imaging of breast: Secondary | ICD-10-CM

## 2024-02-20 ENCOUNTER — Ambulatory Visit
Admission: RE | Admit: 2024-02-20 | Discharge: 2024-02-20 | Disposition: A | Discharge status: Home / Self Care | Source: Ambulatory Visit | Attending: Family Medicine | Admitting: Family Medicine

## 2024-02-20 DIAGNOSIS — R928 Other abnormal and inconclusive findings on diagnostic imaging of breast: Secondary | ICD-10-CM

## 2024-02-20 DIAGNOSIS — R2231 Localized swelling, mass and lump, right upper limb: Secondary | ICD-10-CM | POA: Diagnosis not present

## 2024-03-23 ENCOUNTER — Other Ambulatory Visit: Payer: Self-pay | Admitting: Family Medicine

## 2024-03-23 DIAGNOSIS — J301 Allergic rhinitis due to pollen: Secondary | ICD-10-CM

## 2024-04-24 ENCOUNTER — Other Ambulatory Visit: Payer: Self-pay | Admitting: Family Medicine

## 2024-04-24 DIAGNOSIS — E785 Hyperlipidemia, unspecified: Secondary | ICD-10-CM

## 2024-07-30 ENCOUNTER — Other Ambulatory Visit: Payer: Self-pay | Admitting: Family Medicine

## 2024-07-30 DIAGNOSIS — I10 Essential (primary) hypertension: Secondary | ICD-10-CM

## 2024-08-25 ENCOUNTER — Ambulatory Visit
